# Patient Record
Sex: Female | Born: 1965
Health system: Southern US, Community
[De-identification: ages and names within clinical notes are randomized; demographics above are authoritative.]

## PROBLEM LIST (undated history)

## (undated) DIAGNOSIS — I10 Essential (primary) hypertension: Secondary | ICD-10-CM

## (undated) DIAGNOSIS — E559 Vitamin D deficiency, unspecified: Secondary | ICD-10-CM

## (undated) HISTORY — DX: Essential (primary) hypertension: I10

## (undated) HISTORY — PX: ABDOMINAL HYSTERECTOMY: SHX81

## (undated) HISTORY — DX: Vitamin D deficiency, unspecified: E55.9

---

## 1998-12-10 ENCOUNTER — Other Ambulatory Visit: Admission: RE | Admit: 1998-12-10 | Discharge: 1998-12-10 | Payer: Self-pay | Admitting: Gynecology

## 1999-07-24 ENCOUNTER — Ambulatory Visit (HOSPITAL_COMMUNITY): Admission: RE | Admit: 1999-07-24 | Discharge: 1999-07-24 | Payer: Self-pay | Admitting: Gynecology

## 2001-01-10 ENCOUNTER — Other Ambulatory Visit: Admission: RE | Admit: 2001-01-10 | Discharge: 2001-01-10 | Payer: Self-pay | Admitting: Gynecology

## 2001-01-28 ENCOUNTER — Other Ambulatory Visit: Admission: RE | Admit: 2001-01-28 | Discharge: 2001-01-28 | Payer: Self-pay | Admitting: Gynecology

## 2001-05-31 ENCOUNTER — Other Ambulatory Visit: Admission: RE | Admit: 2001-05-31 | Discharge: 2001-05-31 | Payer: Self-pay | Admitting: Obstetrics and Gynecology

## 2001-07-25 ENCOUNTER — Encounter: Payer: Self-pay | Admitting: Obstetrics and Gynecology

## 2001-07-25 ENCOUNTER — Ambulatory Visit (HOSPITAL_COMMUNITY): Admission: RE | Admit: 2001-07-25 | Discharge: 2001-07-25 | Payer: Self-pay | Admitting: Obstetrics and Gynecology

## 2001-09-26 ENCOUNTER — Encounter: Payer: Self-pay | Admitting: Obstetrics and Gynecology

## 2001-09-26 ENCOUNTER — Ambulatory Visit (HOSPITAL_COMMUNITY): Admission: RE | Admit: 2001-09-26 | Discharge: 2001-09-26 | Payer: Self-pay | Admitting: Obstetrics and Gynecology

## 2001-10-09 ENCOUNTER — Inpatient Hospital Stay (HOSPITAL_COMMUNITY): Admission: AD | Admit: 2001-10-09 | Discharge: 2001-10-09 | Payer: Self-pay | Admitting: Obstetrics and Gynecology

## 2001-10-19 ENCOUNTER — Inpatient Hospital Stay (HOSPITAL_COMMUNITY): Admission: AD | Admit: 2001-10-19 | Discharge: 2001-10-19 | Payer: Self-pay | Admitting: Obstetrics and Gynecology

## 2001-10-22 ENCOUNTER — Observation Stay (HOSPITAL_COMMUNITY): Admission: AD | Admit: 2001-10-22 | Discharge: 2001-10-23 | Payer: Self-pay | Admitting: Obstetrics and Gynecology

## 2001-10-23 ENCOUNTER — Encounter: Payer: Self-pay | Admitting: Obstetrics and Gynecology

## 2001-10-24 ENCOUNTER — Encounter: Payer: Self-pay | Admitting: Obstetrics and Gynecology

## 2001-10-24 ENCOUNTER — Inpatient Hospital Stay (HOSPITAL_COMMUNITY): Admission: AD | Admit: 2001-10-24 | Discharge: 2001-10-24 | Payer: Self-pay | Admitting: Obstetrics and Gynecology

## 2001-10-27 ENCOUNTER — Ambulatory Visit (HOSPITAL_COMMUNITY): Admission: RE | Admit: 2001-10-27 | Discharge: 2001-10-27 | Payer: Self-pay | Admitting: Obstetrics and Gynecology

## 2001-10-27 ENCOUNTER — Encounter: Payer: Self-pay | Admitting: Obstetrics and Gynecology

## 2001-10-28 ENCOUNTER — Inpatient Hospital Stay (HOSPITAL_COMMUNITY): Admission: AD | Admit: 2001-10-28 | Discharge: 2001-11-06 | Payer: Self-pay | Admitting: Obstetrics and Gynecology

## 2001-10-31 ENCOUNTER — Encounter: Payer: Self-pay | Admitting: Obstetrics and Gynecology

## 2001-11-03 ENCOUNTER — Encounter: Payer: Self-pay | Admitting: Obstetrics and Gynecology

## 2001-11-07 ENCOUNTER — Encounter: Admission: RE | Admit: 2001-11-07 | Discharge: 2001-12-07 | Payer: Self-pay | Admitting: Obstetrics and Gynecology

## 2001-12-08 ENCOUNTER — Encounter: Admission: RE | Admit: 2001-12-08 | Discharge: 2002-01-07 | Payer: Self-pay | Admitting: Obstetrics and Gynecology

## 2002-01-08 ENCOUNTER — Encounter: Admission: RE | Admit: 2002-01-08 | Discharge: 2002-02-07 | Payer: Self-pay | Admitting: Obstetrics and Gynecology

## 2002-02-08 ENCOUNTER — Encounter: Admission: RE | Admit: 2002-02-08 | Discharge: 2002-03-10 | Payer: Self-pay | Admitting: Obstetrics and Gynecology

## 2003-01-25 ENCOUNTER — Other Ambulatory Visit: Admission: RE | Admit: 2003-01-25 | Discharge: 2003-01-25 | Payer: Self-pay | Admitting: Obstetrics and Gynecology

## 2003-04-30 ENCOUNTER — Encounter: Payer: Self-pay | Admitting: Internal Medicine

## 2003-04-30 ENCOUNTER — Encounter: Admission: RE | Admit: 2003-04-30 | Discharge: 2003-04-30 | Payer: Self-pay | Admitting: Internal Medicine

## 2003-07-29 ENCOUNTER — Emergency Department (HOSPITAL_COMMUNITY): Admission: EM | Admit: 2003-07-29 | Discharge: 2003-07-29 | Payer: Self-pay

## 2004-06-02 ENCOUNTER — Other Ambulatory Visit: Admission: RE | Admit: 2004-06-02 | Discharge: 2004-06-02 | Payer: Self-pay | Admitting: Obstetrics and Gynecology

## 2016-09-29 ENCOUNTER — Ambulatory Visit: Payer: Self-pay

## 2016-10-06 ENCOUNTER — Ambulatory Visit: Payer: Self-pay

## 2016-10-15 ENCOUNTER — Ambulatory Visit (INDEPENDENT_AMBULATORY_CARE_PROVIDER_SITE_OTHER): Payer: Managed Care, Other (non HMO) | Admitting: Family Medicine

## 2016-10-15 VITALS — BP 161/107 | HR 60 | Temp 97.8°F | Ht 66.5 in | Wt 223.2 lb

## 2016-10-15 DIAGNOSIS — I10 Essential (primary) hypertension: Secondary | ICD-10-CM

## 2016-10-15 DIAGNOSIS — Z13220 Encounter for screening for lipoid disorders: Secondary | ICD-10-CM | POA: Diagnosis not present

## 2016-10-15 DIAGNOSIS — Z1321 Encounter for screening for nutritional disorder: Secondary | ICD-10-CM

## 2016-10-15 DIAGNOSIS — Z1329 Encounter for screening for other suspected endocrine disorder: Secondary | ICD-10-CM | POA: Diagnosis not present

## 2016-10-15 DIAGNOSIS — Z130 Encounter for screening for diseases of the blood and blood-forming organs and certain disorders involving the immune mechanism: Secondary | ICD-10-CM

## 2016-10-15 MED ORDER — LISINOPRIL-HYDROCHLOROTHIAZIDE 20-25 MG PO TABS: 1.0000 | ORAL_TABLET | Freq: Every day | ORAL | 3 refills | Status: DC

## 2016-10-15 MED ORDER — POTASSIUM CHLORIDE CRYS ER 20 MEQ PO TBCR: 20.0000 meq | EXTENDED_RELEASE_TABLET | Freq: Every day | ORAL | 3 refills | Status: DC

## 2016-10-15 NOTE — Progress Notes (Signed)
Patient ID: Dominique Burton, female    DOB: 12/12/1965, 50 y.o.   MRN: [id number]  PCP: No primary care provider on file.  Chief Complaint  Patient presents with  . Medication Refill    Lisinipril and Klor    Subjective:   HPI 50 year old female presents for hypertension medication refill. [place] and was treated there by a primary care physician. She reports chronic hx of hypertension, vitamin D deficiency. Works at dialysis center. Reports that she checks her blood pressure daily and it typically runs in the 120's. She just took her medication today prior to her office visit and attributes that to an elevated readings today. Recently began to exercise and would like to lose weight. Family hx of hypertension involving both parents and sister. Recent surgery includes a partial hysterectomy 6 months ago.  Social History   Social History  . Marital status: Married    Spouse name: N/A  . Number of children: N/A  . Years of education: N/A   Occupational History  . Not on file.   Social History Main Topics  . Smoking status: Never Smoker  . Smokeless tobacco: Never Used  . Alcohol use No  . Drug use: No  . Sexual activity: Not on file   Other Topics Concern  . Not on file   Social History Narrative  . No narrative on file    Family History  Problem Relation Age of Onset  . Hypertension Mother   . Diabetes Father   . Hypertension Father   . Hypertension Sister   . Heart disease Brother   . Hypertension Brother   . Hypertension Maternal Grandmother   . Hypertension Maternal Grandfather      Review of Systems See HPI  There are no active problems to display for this patient.    Prior to Admission medications   Medication Sig Start Date End Date Taking? Authorizing Provider  lisinopril-hydrochlorothiazide (PRINZIDE,ZESTORETIC) 20-12.5 MG tablet Take 1 tablet by mouth daily.   Yes Historical Provider, MD  potassium  chloride (KLOR-CON) 20 MEQ packet Take by mouth daily.   Yes Historical Provider, MD     Allergies no known allergies     Objective:  Physical Exam  Constitutional: She is oriented to person, place, and time. She appears well-developed and well-nourished.  HENT:  Head: Normocephalic and atraumatic.  Eyes: Conjunctivae and EOM are normal. Pupils are equal, round, and reactive to light.  Neck: Normal range of motion. Neck supple. No tracheal deviation present.  Cardiovascular: Normal rate, regular rhythm, normal heart sounds and intact distal pulses.   No murmur heard. Pulmonary/Chest: Effort normal and breath sounds normal.  Lymphadenopathy:    She has no cervical adenopathy.  Neurological: She is alert and oriented to person, place, and time.  Skin: Skin is warm and dry.  Psychiatric: She has a normal mood and affect. Her behavior is normal. Judgment and thought content normal.      Vitals:   10/15/16 1024 10/15/16 1100  BP: (!) 168/108 (!) 161/107  Pulse: 65 60  Temp: 97.8 F (36.6 C)    Assessment & Plan:  1. Hypertension, unspecified type, unstable, uncontrolled as patient has not taken medication. - Comprehensive metabolic panel Plan: -Lisinopril-hydrochlorothiazide 20-25, 1 tablet daily -Potassium Chloride AS (KDUR) 20 MEQ daily 2. Screening, lipid - Lipid panel  3. Screening for thyroid disorder - Thyroid Panel With TSH  4. Screening for deficiency anemia - CBC with  Differential/Platelet - Ferritin - Vitamin B12  5. Encounter for vitamin deficiency screening - VITAMIN D 25 Hydroxy (Vit-D Deficiency, Fractures)  Establish care and return for follow-up as needed.  Godfrey PickKimberly S. Tiburcio PeaHarris, MSN, FNP-C [facility]

## 2016-10-15 NOTE — Patient Instructions (Addendum)
I have refilled medications.  I will follow-up with you regarding your lab results!   IF you received an x-ray today, you will receive an invoice from [facility]. Please contact [facility] at [phone] with questions or concerns regarding your invoice.   IF you received labwork today, you will receive an invoice from [facility]. Please contact [facility] at [phone] with questions or concerns regarding your invoice.   Our billing staff will not be able to assist you with questions regarding bills from these companies.  You will be contacted with the lab results as soon as they are available. The fastest way to get your results is to activate your My Chart account. Instructions are located on the last page of this paperwork. If you have not heard from us regarding the results in 2 weeks, please contact this office.     Hypertension Hypertension, commonly called high blood pressure, is when the force of blood pumping through your arteries is too strong. Your arteries are the blood vessels that carry blood from your heart throughout your body. A blood pressure reading consists of a higher number over a lower number, such as 110/72. The higher number (systolic) is the pressure inside your arteries when your heart pumps. The lower number (diastolic) is the pressure inside your arteries when your heart relaxes. Ideally you want your blood pressure below 120/80. Hypertension forces your heart to work harder to pump blood. Your arteries may become narrow or stiff. Having untreated or uncontrolled hypertension can cause heart attack, stroke, kidney disease, and other problems. What increases the risk? Some risk factors for high blood pressure are controllable. Others are not. Risk factors you cannot control include:  Race. You may be at higher risk if you are African American.  Age. Risk increases with age.  Gender. Men are at higher risk than  women before age 51 years. After age 51, women are at higher risk than men. Risk factors you can control include:  Not getting enough exercise or physical activity.  Being overweight.  Getting too much fat, sugar, calories, or salt in your diet.  Drinking too much alcohol. What are the signs or symptoms? Hypertension does not usually cause signs or symptoms. Extremely high blood pressure (hypertensive crisis) may cause headache, anxiety, shortness of breath, and nosebleed. How is this diagnosed? To check if you have hypertension, your health care provider will measure your blood pressure while you are seated, with your arm held at the level of your heart. It should be measured at least twice using the same arm. Certain conditions can cause a difference in blood pressure between your right and left arms. A blood pressure reading that is higher than normal on one occasion does not mean that you need treatment. If it is not clear whether you have high blood pressure, you may be asked to return on a different day to have your blood pressure checked again. Or, you may be asked to monitor your blood pressure at home for 1 or more weeks. How is this treated? Treating high blood pressure includes making lifestyle changes and possibly taking medicine. Living a healthy lifestyle can help lower high blood pressure. You may need to change some of your habits. Lifestyle changes may include:  Following the DASH diet. This diet is high in fruits, vegetables, and whole grains. It is low in salt, red meat, and added sugars.  Keep your sodium intake below 2,300 mg per day.  Getting at least 30-45  minutes of aerobic exercise at least 4 times per week.  Losing weight if necessary.  Not smoking.  Limiting alcoholic beverages.  Learning ways to reduce stress. Your health care provider may prescribe medicine if lifestyle changes are not enough to get your blood pressure under control, and if one of the  following is true:  You are 7018-51 years of age and your systolic blood pressure is above 140.  You are 51 years of age or older, and your systolic blood pressure is above 150.  Your diastolic blood pressure is above 90.  You have diabetes, and your systolic blood pressure is over 140 or your diastolic blood pressure is over 90.  You have kidney disease and your blood pressure is above 140/90.  You have heart disease and your blood pressure is above 140/90. Your personal target blood pressure may vary depending on your medical conditions, your age, and other factors. Follow these instructions at home:  Have your blood pressure rechecked as directed by your health care provider.  Take medicines only as directed by your health care provider. Follow the directions carefully. Blood pressure medicines must be taken as prescribed. The medicine does not work as well when you skip doses. Skipping doses also puts you at risk for problems.  Do not smoke.  Monitor your blood pressure at home as directed by your health care provider. Contact a health care provider if:  You think you are having a reaction to medicines taken.  You have recurrent headaches or feel dizzy.  You have swelling in your ankles.  You have trouble with your vision. Get help right away if:  You develop a severe headache or confusion.  You have unusual weakness, numbness, or feel faint.  You have severe chest or abdominal pain.  You vomit repeatedly.  You have trouble breathing. This information is not intended to replace advice given to you by your health care provider. Make sure you discuss any questions you have with your health care provider. Document Released: 10/26/2005 Document Revised: 04/02/2016 Document Reviewed: 08/18/2013 Elsevier Interactive Patient Education  2017 [employer]

## 2016-10-16 LAB — VITAMIN B12: Vitamin B-12: 267 pg/mL (ref 232–1245)

## 2016-10-16 LAB — COMPREHENSIVE METABOLIC PANEL
ALBUMIN: 4.5 g/dL (ref 3.5–5.5)
ALK PHOS: 92 IU/L (ref 39–117)
ALT: 10 IU/L (ref 0–32)
AST: 16 IU/L (ref 0–40)
Albumin/Globulin Ratio: 1.6 (ref 1.2–2.2)
BUN/Creatinine Ratio: 12 (ref 9–23)
BUN: 8 mg/dL (ref 6–24)
Bilirubin Total: 0.4 mg/dL (ref 0.0–1.2)
CALCIUM: 9.3 mg/dL (ref 8.7–10.2)
CO2: 27 mmol/L (ref 18–29)
CREATININE: 0.65 mg/dL (ref 0.57–1.00)
Chloride: 102 mmol/L (ref 96–106)
GFR calc Af Amer: 119 mL/min/{1.73_m2} (ref >59)
GFR calc non Af Amer: 103 mL/min/{1.73_m2} (ref >59)
GLUCOSE: 84 mg/dL (ref 65–99)
Globulin, Total: 2.8 g/dL (ref 1.5–4.5)
Potassium: 4.1 mmol/L (ref 3.5–5.2)
Sodium: 144 mmol/L (ref 134–144)
Total Protein: 7.3 g/dL (ref 6.0–8.5)

## 2016-10-16 LAB — THYROID PANEL WITH TSH
FREE THYROXINE INDEX: 1.7 (ref 1.2–4.9)
T3 Uptake Ratio: 25 % (ref 24–39)
T4 TOTAL: 6.8 ug/dL (ref 4.5–12.0)
TSH: 1.4 u[IU]/mL (ref 0.450–4.500)

## 2016-10-16 LAB — CBC WITH DIFFERENTIAL/PLATELET
BASOS ABS: 0 10*3/uL (ref 0.0–0.2)
Basos: 0 %
EOS (ABSOLUTE): 0.1 10*3/uL (ref 0.0–0.4)
Eos: 1 %
HEMOGLOBIN: 13.4 g/dL (ref 11.1–15.9)
Hematocrit: 41 % (ref 34.0–46.6)
IMMATURE GRANULOCYTES: 0 %
Immature Grans (Abs): 0 10*3/uL (ref 0.0–0.1)
LYMPHS ABS: 1.8 10*3/uL (ref 0.7–3.1)
LYMPHS: 28 %
MCH: 28.8 pg (ref 26.6–33.0)
MCHC: 32.7 g/dL (ref 31.5–35.7)
MCV: 88 fL (ref 79–97)
MONOCYTES: 6 %
Monocytes Absolute: 0.4 10*3/uL (ref 0.1–0.9)
NEUTROS PCT: 65 %
Neutrophils Absolute: 4.3 10*3/uL (ref 1.4–7.0)
Platelets: 328 10*3/uL (ref 150–379)
RBC: 4.66 x10E6/uL (ref 3.77–5.28)
RDW: 15 % (ref 12.3–15.4)
WBC: 6.6 10*3/uL (ref 3.4–10.8)

## 2016-10-16 LAB — FERRITIN: Ferritin: 98 ng/mL (ref 15–150)

## 2016-10-16 LAB — VITAMIN D 25 HYDROXY (VIT D DEFICIENCY, FRACTURES): VIT D 25 HYDROXY: 22.2 ng/mL — AB (ref 30.0–100.0)

## 2016-10-16 MED ORDER — VITAMIN D (ERGOCALCIFEROL) 1.25 MG (50000 UNIT) PO CAPS: 50000.0000 [IU] | ORAL_CAPSULE | ORAL | 1 refills | Status: DC

## 2016-10-17 ENCOUNTER — Encounter: Payer: Self-pay | Admitting: Family Medicine

## 2016-10-17 ENCOUNTER — Telehealth: Payer: Self-pay

## 2016-10-17 LAB — LIPID PANEL

## 2016-10-17 NOTE — Telephone Encounter (Signed)
MS. Dominique Burton    Can you check for cholestrol  From Fridays blood draw  [phone]

## 2016-10-17 NOTE — Telephone Encounter (Signed)
[facility] notified of cancelled test. The test was received yesterday and will be resulted today.

## 2016-10-18 LAB — LIPID PANEL
CHOL/HDL RATIO: 2.6 ratio (ref 0.0–4.4)
Cholesterol, Total: 154 mg/dL (ref 100–199)
HDL: 59 mg/dL (ref >39)
LDL CALC: 72 mg/dL (ref 0–99)
TRIGLYCERIDES: 113 mg/dL (ref 0–149)
VLDL CHOLESTEROL CAL: 23 mg/dL (ref 5–40)

## 2016-10-18 LAB — SPECIMEN STATUS REPORT

## 2016-10-19 ENCOUNTER — Telehealth: Payer: Self-pay

## 2016-10-19 NOTE — Telephone Encounter (Signed)
Patient had some medications refilled on 12/07.  She states that the wrong dosage of medication was filled.  She states that she needs the potassium chloride 20-12.5mg  instead of the 20-25mg  tablet.  Please advise!!  [phone]

## 2016-10-20 NOTE — Telephone Encounter (Signed)
Please advise 

## 2016-10-20 NOTE — Telephone Encounter (Signed)
Here dosage is correct. I increased her diuretic portion as her blood pressure, specifically her diastolic readings were very high on exam. I did explain to her that I was increasing the HCTZ portion of her medication. Please advise patient.   Thanks

## 2016-10-22 NOTE — Telephone Encounter (Signed)
Advised to call clinic if needed further instructions

## 2016-10-22 NOTE — Telephone Encounter (Signed)
Left detailed message per Pump[place] Hollow[place] message.

## 2016-11-17 ENCOUNTER — Other Ambulatory Visit: Payer: Self-pay | Admitting: Family Medicine

## 2016-11-17 MED ORDER — OSELTAMIVIR PHOSPHATE 75 MG PO CAPS: 75.0000 mg | ORAL_CAPSULE | Freq: Every day | ORAL | 0 refills | Status: DC

## 2016-11-17 NOTE — Progress Notes (Signed)
Tamiflu prevention. Daughter dx with influenza x 1 week ago. Son now has symptoms of influenza.

## 2017-02-05 ENCOUNTER — Emergency Department (HOSPITAL_COMMUNITY)
Admission: EM | Admit: 2017-02-05 | Discharge: 2017-02-05 | Disposition: A | Discharge status: Home / Self Care | Payer: Managed Care, Other (non HMO) | Attending: Emergency Medicine | Admitting: Emergency Medicine

## 2017-02-05 ENCOUNTER — Encounter (HOSPITAL_COMMUNITY): Payer: Self-pay

## 2017-02-05 ENCOUNTER — Emergency Department (HOSPITAL_COMMUNITY): Payer: Managed Care, Other (non HMO)

## 2017-02-05 DIAGNOSIS — Z79899 Other long term (current) drug therapy: Secondary | ICD-10-CM | POA: Insufficient documentation

## 2017-02-05 DIAGNOSIS — I10 Essential (primary) hypertension: Secondary | ICD-10-CM | POA: Diagnosis not present

## 2017-02-05 LAB — BASIC METABOLIC PANEL
Anion gap: 6 (ref 5–15)
BUN: 8 mg/dL (ref 6–20)
CALCIUM: 9.3 mg/dL (ref 8.9–10.3)
CO2: 29 mmol/L (ref 22–32)
CREATININE: 0.86 mg/dL (ref 0.44–1.00)
Chloride: 102 mmol/L (ref 101–111)
GFR calc non Af Amer: >60 mL/min (ref >60)
Glucose, Bld: 94 mg/dL (ref 65–99)
Potassium: 3.4 mmol/L — ABNORMAL LOW (ref 3.5–5.1)
SODIUM: 137 mmol/L (ref 135–145)

## 2017-02-05 LAB — CBC
HEMATOCRIT: 38.7 % (ref 36.0–46.0)
HEMOGLOBIN: 13.2 g/dL (ref 12.0–15.0)
MCH: 28.8 pg (ref 26.0–34.0)
MCHC: 34.1 g/dL (ref 30.0–36.0)
MCV: 84.3 fL (ref 78.0–100.0)
Platelets: 320 10*3/uL (ref 150–400)
RBC: 4.59 MIL/uL (ref 3.87–5.11)
RDW: 13.6 % (ref 11.5–15.5)
WBC: 5.5 10*3/uL (ref 4.0–10.5)

## 2017-02-05 LAB — I-STAT TROPONIN, ED
TROPONIN I, POC: 0 ng/mL (ref 0.00–0.08)
Troponin i, poc: 0 ng/mL (ref 0.00–0.08)

## 2017-02-05 NOTE — ED Triage Notes (Signed)
PT C/O HTN SINCE LAST NIGHT. PT STS SHE TOOK LISINOPRIL. THIS MORNING, HER PRESSURE WAS STILL ELEVATED, SO SHE TOOK ANOTHER LISINOPRIL  PTA. PT ALSO C/O LEFT-SIDED CHEST PRESSURE.

## 2017-02-05 NOTE — ED Provider Notes (Signed)
[facility]-EMERGENCY DEPT Provider Note   CSN: [id number] Arrival date & time: 02/05/17  1005     History   Chief Complaint Chief Complaint  Patient presents with  . Hypertension    HPI Dominique Burton is a 51 y.o. female.  HPI   Patient presents to the ER with PMH of hypertension and vitamin D deficiency with complaints of hypertension.  She takes lisinopril-HCTZ and potassium. She checks her BP bid and it typically runs 120's/80's. She has been extremely stressed out today and developed left sided chest pressure. The pain lasted an hour and did not radiate, was not associated with any nausea, diarrhea, diaphoresis, vomiting, back pain, radiation. Exertion/rest did not improve her symptoms. She took her BP and reports it was 237/100? Upon arrival her BP is now 168/95 without any intervention. She is no longer having any chest pain or any other symptoms. She admits that she was feeling anxious and stressed this morning. No LE swelling. No headaches, weakness, confusion, ataxia, aphagia. No known heart disease.  Past Medical History:  Diagnosis Date  . Hypertension     There are no active problems to display for this patient.   Past Surgical History:  Procedure Laterality Date  . ABDOMINAL HYSTERECTOMY     PARTIAL  . CESAREAN SECTION      OB History    No data available       Home Medications    Prior to Admission medications   Medication Sig Start Date End Date Taking? Authorizing Provider  lisinopril-hydrochlorothiazide (PRINZIDE,ZESTORETIC) 20-25 MG tablet Take 1 tablet by mouth daily. 10/15/16  Yes Doyle Askew, FNP  potassium chloride SA (K-DUR,KLOR-CON) 20 MEQ tablet Take 1 tablet (20 mEq total) by mouth daily. 10/15/16  Yes Doyle Askew, FNP  Vitamin D, Ergocalciferol, (DRISDOL) 50000 units CAPS capsule Take 1 capsule (50,000 Units total) by mouth every 7 (seven) days. 10/16/16  Yes Doyle Askew, FNP  oseltamivir (TAMIFLU) 75 MG  capsule Take 1 capsule (75 mg total) by mouth daily. Patient not taking: Reported on 02/05/2017 11/17/16   Doyle Askew, FNP    Family History Family History  Problem Relation Age of Onset  . Hypertension Mother   . Diabetes Father   . Hypertension Father   . Hypertension Sister   . Heart disease Brother   . Hypertension Brother   . Hypertension Maternal Grandmother   . Hypertension Maternal Grandfather     Social History Social History  Substance Use Topics  . Smoking status: Never Smoker  . Smokeless tobacco: Never Used  . Alcohol use No     Allergies   Patient has no known allergies.   Review of Systems Review of Systems Review of Systems All other systems negative except as documented in the HPI. All pertinent positives and negatives as reviewed in the HPI.   Physical Exam Updated Vital Signs BP (!) 168/95   Pulse 67   Temp 97.7 F (36.5 C) (Oral)   Resp 12   Ht  (1.676 m)   Wt 99.8 kg   SpO2 100%   BMI 35.51 kg/m   Physical Exam  Constitutional: She appears well-developed and well-nourished. No distress.  HENT:  Head: Normocephalic and atraumatic.  Right Ear: Tympanic membrane and ear canal normal.  Left Ear: Tympanic membrane and ear canal normal.  Nose: Nose normal.  Mouth/Throat: Uvula is midline, oropharynx is clear and moist and mucous membranes are normal.  Eyes: Pupils are equal, round, and  reactive to light.  Neck: Normal range of motion. Neck supple.  Cardiovascular: Normal rate and regular rhythm.   Pulses:      Carotid pulses are 0 on the right side, and 0 on the left side. Pulmonary/Chest: Effort normal.  Abdominal: Soft.  No signs of abdominal distention  Musculoskeletal:  No LE swelling  Neurological: She is alert.  Acting at baseline  Skin: Skin is warm and dry. No rash noted.  Nursing note and vitals reviewed.   ED Treatments / Results  Labs (all labs ordered are listed, but only abnormal results are  displayed) Labs Reviewed  BASIC METABOLIC PANEL - Abnormal; Notable for the following:       Result Value   Potassium 3.4 (*)    All other components within normal limits  CBC  I-STAT TROPOININ, ED    EKG  EKG Interpretation  Date/Time:  Friday February 05 2017 10:13:16 EDT Ventricular Rate:  67 PR Interval:    QRS Duration: 99 QT Interval:  434 QTC Calculation: 459 R Axis:   1 Text Interpretation:  Sinus rhythm Low voltage, precordial leads Abnormal R-wave progression, early transition No significant change since last tracing Confirmed by ALLEN  MD, ANTHONY ([id number]) on 02/05/2017 11:22:55 AM       Radiology Dg Chest 2 View  Result Date: 02/05/2017 CLINICAL DATA:  Left-sided chest pain, pressure EXAM: CHEST  2 VIEW COMPARISON:  None. FINDINGS: Lungs are clear.  No pleural effusion or pneumothorax. The heart is normal in size. Mild degenerative changes of the visualized thoracolumbar spine. IMPRESSION: Normal chest radiographs. Electronically Signed   By: Charline Bills M.D.   On: 02/05/2017 10:58    Procedures Procedures (including critical care time)  Medications Ordered in ED Medications - No data to display   Initial Impression / Assessment and Plan / ED Course  I have reviewed the triage vital signs and the nursing notes.  Pertinent labs & imaging results that were available during my care of the patient were reviewed by me and considered in my medical decision making (see chart for details).     Patient has had two negative troponins in the setting of atypical chest pain. Her blood pressure is typically normal but today was elevated while stressed and anxious over personal reasons. Her heart score is 2- age and hypertension. Since her BP has been running normal and we did not provide any intervention for it to resolve I will not start her on any antihypertensive medications at this visit. I recommend she have close follow-up with her PCP and will refer her to  cardiology for outpatient work-up. Advised her if chest pain returns or her BP shoots up again to come back to the ER for further evaluation.  Medications - No data to display  I discussed results, diagnoses and plan with Hanley Hays. They voice there understanding and questions were answered. We discussed follow-up recommendations and return precautions.   Final Clinical Impressions(s) / ED Diagnoses   Final diagnoses:  Hypertension, unspecified type    New Prescriptions New Prescriptions   No medications on file     Marlon Pel, Cordelia Poche 02/05/17 1242    Lorre Nick, MD 02/07/17 [phone]

## 2017-04-11 ENCOUNTER — Other Ambulatory Visit: Payer: Self-pay | Admitting: Family Medicine

## 2017-04-30 ENCOUNTER — Telehealth: Payer: Self-pay | Admitting: Family Medicine

## 2017-04-30 MED ORDER — POTASSIUM CHLORIDE CRYS ER 20 MEQ PO TBCR: 20.0000 meq | EXTENDED_RELEASE_TABLET | Freq: Every day | ORAL | 0 refills | Status: DC

## 2017-04-30 MED ORDER — LISINOPRIL-HYDROCHLOROTHIAZIDE 20-25 MG PO TABS: 1.0000 | ORAL_TABLET | Freq: Every day | ORAL | 0 refills | Status: DC

## 2017-04-30 NOTE — Telephone Encounter (Signed)
PT CALLING FOR A REFILL ON LISINOPRIL-HCTZ AND POTASSIUM PILL SHE WAS A [facility] PT NOW SHE WILL BE SEEING ENGLISH ON 05-13-17 PLEASE FILL UNTIL APPOINTMENT USE THE [facility] ON [address]

## 2017-04-30 NOTE — Telephone Encounter (Signed)
Please advise 

## 2017-05-13 ENCOUNTER — Ambulatory Visit: Payer: Managed Care, Other (non HMO) | Admitting: Physician Assistant

## 2017-05-20 ENCOUNTER — Ambulatory Visit: Payer: Managed Care, Other (non HMO) | Admitting: Physician Assistant

## 2017-05-25 ENCOUNTER — Ambulatory Visit: Payer: Managed Care, Other (non HMO) | Admitting: Physician Assistant

## 2017-06-03 ENCOUNTER — Ambulatory Visit (INDEPENDENT_AMBULATORY_CARE_PROVIDER_SITE_OTHER): Payer: Managed Care, Other (non HMO) | Admitting: Physician Assistant

## 2017-06-03 ENCOUNTER — Encounter: Payer: Self-pay | Admitting: Physician Assistant

## 2017-06-03 VITALS — BP 130/85 | HR 67 | Temp 98.6°F | Resp 18 | Ht 65.75 in | Wt 225.8 lb

## 2017-06-03 DIAGNOSIS — E559 Vitamin D deficiency, unspecified: Secondary | ICD-10-CM | POA: Diagnosis not present

## 2017-06-03 DIAGNOSIS — I10 Essential (primary) hypertension: Secondary | ICD-10-CM

## 2017-06-03 DIAGNOSIS — F411 Generalized anxiety disorder: Secondary | ICD-10-CM

## 2017-06-03 MED ORDER — SERTRALINE HCL 50 MG PO TABS: 25.0000 mg | ORAL_TABLET | Freq: Every day | ORAL | 1 refills | Status: DC

## 2017-06-03 MED ORDER — LISINOPRIL-HYDROCHLOROTHIAZIDE 20-12.5 MG PO TABS
1.0000 | ORAL_TABLET | Freq: Every day | ORAL | 1 refills | Status: DC
Start: 2017-06-03 — End: 2017-11-11

## 2017-06-03 MED ORDER — POTASSIUM CHLORIDE CRYS ER 20 MEQ PO TBCR: 20.0000 meq | EXTENDED_RELEASE_TABLET | Freq: Every day | ORAL | 1 refills | Status: DC

## 2017-06-03 NOTE — Patient Instructions (Signed)
     IF you received an x-ray today, you will receive an invoice from [facility]. Please contact  Radiology at [phone] with questions or concerns regarding your invoice.   IF you received labwork today, you will receive an invoice from [facility]. Please contact [facility] at [phone] with questions or concerns regarding your invoice.   Our billing staff will not be able to assist you with questions regarding bills from these companies.  You will be contacted with the lab results as soon as they are available. The fastest way to get your results is to activate your My Chart account. Instructions are located on the last page of this paperwork. If you have not heard from us regarding the results in 2 weeks, please contact this office.     

## 2017-06-04 LAB — BASIC METABOLIC PANEL
BUN / CREAT RATIO: 12 (ref 9–23)
BUN: 8 mg/dL (ref 6–24)
CO2: 27 mmol/L (ref 20–29)
Calcium: 9.8 mg/dL (ref 8.7–10.2)
Chloride: 100 mmol/L (ref 96–106)
Creatinine, Ser: 0.69 mg/dL (ref 0.57–1.00)
GFR calc Af Amer: 116 mL/min/{1.73_m2} (ref >59)
GFR, EST NON AFRICAN AMERICAN: 100 mL/min/{1.73_m2} (ref >59)
Glucose: 100 mg/dL — ABNORMAL HIGH (ref 65–99)
POTASSIUM: 3.7 mmol/L (ref 3.5–5.2)
SODIUM: 142 mmol/L (ref 134–144)

## 2017-06-04 LAB — CBC
Hematocrit: 41.8 % (ref 34.0–46.6)
Hemoglobin: 13.4 g/dL (ref 11.1–15.9)
MCH: 28.6 pg (ref 26.6–33.0)
MCHC: 32.1 g/dL (ref 31.5–35.7)
MCV: 89 fL (ref 79–97)
PLATELETS: 262 10*3/uL (ref 150–379)
RBC: 4.69 x10E6/uL (ref 3.77–5.28)
RDW: 14.6 % (ref 12.3–15.4)
WBC: 7.6 10*3/uL (ref 3.4–10.8)

## 2017-06-04 LAB — VITAMIN D 25 HYDROXY (VIT D DEFICIENCY, FRACTURES): VIT D 25 HYDROXY: 22.6 ng/mL — AB (ref 30.0–100.0)

## 2017-06-04 NOTE — Progress Notes (Signed)
[facility] [address], [place] [phone]  Date:  06/03/2017   Name:  Dominique Burton   DOB:  12/16/1965   MRN:  [id number]  PCP:  Patient, No Pcp Per    History of Present Illness:  Dominique Haysaula Y Fulbright is a 51 y.o. female patient who presents to PCP with  Chief Complaint  Patient presents with  . Hypertension    wants to discuss meds     --patient would like a refill of her hypertensive medication.  Patient reports that she used to take the zestoretic 20-12.5, but this was increased somehow.  She had noted that she had more fatigue with the other 20-25.   --she denies cp, palpitations, sob, or dyspnea.  No leg swellings. --she has not taken any medication today. --she is also concerned of her anxiety.  She has moments of intense worrying.  She worries for her children, and her family.  She was once issued a medicaiton for her anxiety (zoloft) but she had never picked it up. --she denies feelings of sadness, loss of interest, si/hi. --she is fairly happy person --she has a hx of panic attacks years ago when raising her children as babies, 25 years ago.  States her spirituality helped her through prayer.  Continues her spirtuality.  She is worried for the stressors that are coming.  Her oldest daughter is expecting in about 2 weeks.  She is very supportive. --vitamin D, finished months ago, however never rechecked.  There are no active problems to display for this patient.   Past Medical History:  Diagnosis Date  . Hypertension     Past Surgical History:  Procedure Laterality Date  . ABDOMINAL HYSTERECTOMY     PARTIAL  . CESAREAN SECTION      Social History  Substance Use Topics  . Smoking status: Never Smoker  . Smokeless tobacco: Never Used  . Alcohol use No    Family History  Problem Relation Age of Onset  . Hypertension Mother   . Diabetes Father   . Hypertension Father   . Hypertension Sister   . Heart disease Brother   . Hypertension  Brother   . Hypertension Maternal Grandmother   . Hypertension Maternal Grandfather     No Known Allergies  Medication list has been reviewed and updated.  Current Outpatient Prescriptions on File Prior to Visit  Medication Sig Dispense Refill  . Vitamin D, Ergocalciferol, (DRISDOL) 50000 units CAPS capsule Take 1 capsule (50,000 Units total) by mouth every 7 (seven) days. 13 capsule 1  . oseltamivir (TAMIFLU) 75 MG capsule Take 1 capsule (75 mg total) by mouth daily. (Patient not taking: Reported on 02/05/2017) 10 capsule 0   No current facility-administered medications on file prior to visit.     ROS ROS otherwise unremarkable unless listed above.  Physical Examination: BP 130/85   Pulse 67   Temp 98.6 F (37 C) (Oral)   Resp 18   Ht 5' 5.75" (1.67 m)   Wt 225 lb 12.8 oz (102.4 kg)   SpO2 96%   BMI 36.72 kg/m  Ideal Body Weight: Weight in (lb) to have BMI = 25: 153.4  Physical Exam  Constitutional: She is oriented to person, place, and time. She appears well-developed and well-nourished. No distress.  HENT:  Head: Normocephalic and atraumatic.  Right Ear: External ear normal.  Left Ear: External ear normal.  Eyes: Pupils are equal, round, and reactive to light. Conjunctivae and EOM are  normal.  Neck: No thyroid mass and no thyromegaly present.  Cardiovascular: Normal rate, regular rhythm and intact distal pulses.  Exam reveals no friction rub.   No murmur heard. Pulmonary/Chest: Effort normal. No respiratory distress. She has no wheezes.  Neurological: She is alert and oriented to person, place, and time.  Skin: She is not diaphoretic.  Psychiatric: She has a normal mood and affect. Her behavior is normal.     Assessment and Plan: Dominique Haysaula Y Kirkpatrick is a 51 y.o. female who is here today for cc of bp recheck, medication refill, and anxiety bp stable, will decrease to the 20-12.5, recheck in 6 months Starting zoloft 25mg --she wants this but is reluctant.  Discussed the  continued use of the zoloft.  Follow up in 5 weeks.  Essential hypertension - Plan: Basic metabolic panel, CBC, potassium chloride SA (K-DUR,KLOR-CON) 20 MEQ tablet, lisinopril-hydrochlorothiazide (ZESTORETIC) 20-12.5 MG tablet  Vitamin D deficiency - Plan: VITAMIN D 25 Hydroxy (Vit-D Deficiency, Fractures)  Generalized anxiety disorder - Plan: sertraline (ZOLOFT) 50 MG tablet  Trena PlattStephanie English, PA-C [facility] 7/27/201810:10 AM

## 2017-06-07 ENCOUNTER — Telehealth: Payer: Self-pay | Admitting: Family Medicine

## 2017-06-07 NOTE — Telephone Encounter (Signed)
Pt calling about labs °

## 2017-06-07 NOTE — Telephone Encounter (Signed)
Can you review these results

## 2017-06-10 ENCOUNTER — Other Ambulatory Visit: Payer: Self-pay | Admitting: Physician Assistant

## 2017-06-10 DIAGNOSIS — E559 Vitamin D deficiency, unspecified: Secondary | ICD-10-CM

## 2017-11-11 ENCOUNTER — Ambulatory Visit (INDEPENDENT_AMBULATORY_CARE_PROVIDER_SITE_OTHER): Payer: 59 | Admitting: Physician Assistant

## 2017-11-11 ENCOUNTER — Other Ambulatory Visit: Payer: Self-pay

## 2017-11-11 ENCOUNTER — Encounter: Payer: Self-pay | Admitting: Physician Assistant

## 2017-11-11 VITALS — BP 162/97 | HR 77 | Temp 98.8°F | Resp 16 | Ht 65.0 in | Wt 223.0 lb

## 2017-11-11 DIAGNOSIS — J069 Acute upper respiratory infection, unspecified: Secondary | ICD-10-CM

## 2017-11-11 DIAGNOSIS — R070 Pain in throat: Secondary | ICD-10-CM

## 2017-11-11 DIAGNOSIS — I10 Essential (primary) hypertension: Secondary | ICD-10-CM | POA: Diagnosis not present

## 2017-11-11 LAB — POCT RAPID STREP A (OFFICE): Rapid Strep A Screen: NEGATIVE

## 2017-11-11 MED ORDER — LISINOPRIL-HYDROCHLOROTHIAZIDE 20-12.5 MG PO TABS: 1.0000 | ORAL_TABLET | Freq: Every day | ORAL | 1 refills | Status: DC

## 2017-11-11 MED ORDER — POTASSIUM CHLORIDE CRYS ER 20 MEQ PO TBCR: 20.0000 meq | EXTENDED_RELEASE_TABLET | Freq: Every day | ORAL | 1 refills | Status: DC

## 2017-11-11 MED ORDER — GUAIFENESIN ER 1200 MG PO TB12: 1.0000 | ORAL_TABLET | Freq: Two times a day (BID) | ORAL | 1 refills | Status: DC | PRN

## 2017-11-11 NOTE — Progress Notes (Signed)
[facility] PRIMARY CARE AT [facility] [address], [place] [address] [phone]  Date:  11/11/2017   Name:  Dominique Burton   DOB:  [birth year]   MRN:  [id number]  PCP:  Patient, No Pcp Per    History of Present Illness:  Dominique Burton is a 52 y.o. female patient who presents to PCP with  Chief Complaint  Patient presents with  . Sore Throat    x 1 day  . Nasal Congestion    x1 day     Today this morning, started sore throat and nasal congestion.   No fever No sob or dyspnea No cough. No ear discomfort.     There are no active problems to display for this patient.   Past Medical History:  Diagnosis Date  . Hypertension     Past Surgical History:  Procedure Laterality Date  . ABDOMINAL HYSTERECTOMY     PARTIAL  . CESAREAN SECTION      Social History   Tobacco Use  . Smoking status: Never Smoker  . Smokeless tobacco: Never Used  Substance Use Topics  . Alcohol use: No  . Drug use: No    Family History  Problem Relation Age of Onset  . Hypertension Mother   . Diabetes Father   . Hypertension Father   . Hypertension Sister   . Heart disease Brother   . Hypertension Brother   . Hypertension Maternal Grandmother   . Hypertension Maternal Grandfather     No Known Allergies  Medication list has been reviewed and updated.  Current Outpatient Medications on File Prior to Visit  Medication Sig Dispense Refill  . Vitamin D, Ergocalciferol, (DRISDOL) 50000 units CAPS capsule Take 1 capsule (50,000 Units total) by mouth every 7 (seven) days. 13 capsule 1   No current facility-administered medications on file prior to visit.     Review of Systems  HENT: Positive for congestion. Negative for ear pain, sinus pain and sore throat.   Cardiovascular: Negative for chest pain, palpitations and leg swelling.   ROS otherwise unremarkable unless listed above.  Physical Examination: BP (!) 162/97   Pulse 77   Temp 98.8 F (37.1 C) (Oral)   Resp 16   Ht 5\' 5"   (1.651 m)   Wt 223 lb (101.2 kg)   SpO2 98%   BMI 37.11 kg/m  Ideal Body Weight: Weight in (lb) to have BMI = 25: 149.9  Physical Exam  Constitutional: She is oriented to person, place, and time. She appears well-developed and well-nourished. No distress.  HENT:  Head: Normocephalic and atraumatic.  Right Ear: Tympanic membrane, external ear and ear canal normal.  Left Ear: Tympanic membrane, external ear and ear canal normal.  Nose: Mucosal edema and rhinorrhea present. Right sinus exhibits no maxillary sinus tenderness and no frontal sinus tenderness. Left sinus exhibits no maxillary sinus tenderness and no frontal sinus tenderness.  Mouth/Throat: No uvula swelling. No oropharyngeal exudate, posterior oropharyngeal edema or posterior oropharyngeal erythema.  Eyes: Conjunctivae and EOM are normal. Pupils are equal, round, and reactive to light.  Cardiovascular: Normal rate, regular rhythm and normal heart sounds. Exam reveals no gallop, no distant heart sounds and no friction rub.  No murmur heard. Pulmonary/Chest: Effort normal. No apnea. No respiratory distress. She has no decreased breath sounds. She has no wheezes. She has no rhonchi.  Lymphadenopathy:       Head (right side): No submandibular, no tonsillar, no preauricular and no posterior auricular adenopathy present.  Head (left side): No submandibular, no tonsillar, no preauricular and no posterior auricular adenopathy present.  Neurological: She is alert and oriented to person, place, and time.  Skin: She is not diaphoretic.  Psychiatric: She has a normal mood and affect. Her behavior is normal.     Assessment and Plan: Hanley Haysaula Y Panozzo is a 52 y.o. female who is here today for cc of  Chief Complaint  Patient presents with  . Sore Throat    x 1 day  . Nasal Congestion    x1 day   Acute upper respiratory infection  Essential hypertension - Plan: Basic metabolic panel, potassium chloride SA (K-DUR,KLOR-CON) 20 MEQ  tablet, lisinopril-hydrochlorothiazide (ZESTORETIC) 20-12.5 MG tablet, VITAMIN D 25 Hydroxy (Vit-D Deficiency, Fractures), CBC  Throat pain - Plan: POCT rapid strep A, Culture, Group A Strep, CBC  Trena PlattStephanie English, PA-C [facility] 1/15/20198:40 AM

## 2017-11-11 NOTE — Patient Instructions (Addendum)
I would like you to take tylenol for pain and fever.  I am obtaining a strep culture for the throat pain.   Gargle with salt water.  You can also use cepacol throat lozenges.    Pharyngitis Pharyngitis is a sore throat (pharynx). There is redness, pain, and swelling of your throat. Follow these instructions at home:  Drink enough fluids to keep your pee (urine) clear or pale yellow.  Only take medicine as told by your doctor. ? You may get sick again if you do not take medicine as told. Finish your medicines, even if you start to feel better. ? Do not take aspirin.  Rest.  Rinse your mouth (gargle) with salt water ( tsp of salt per 1 qt of water) every 1-2 hours. This will help the pain.  If you are not at risk for choking, you can suck on hard candy or sore throat lozenges. Contact a doctor if:  You have large, tender lumps on your neck.  You have a rash.  You cough up green, yellow-brown, or bloody spit. Get help right away if:  You have a stiff neck.  You drool or cannot swallow liquids.  You throw up (vomit) or are not able to keep medicine or liquids down.  You have very bad pain that does not go away with medicine.  You have problems breathing (not from a stuffy nose). This information is not intended to replace advice given to you by your health care provider. Make sure you discuss any questions you have with your health care provider. Document Released: 04/13/2008 Document Revised: 04/02/2016 Document Reviewed: 07/03/2013 Elsevier Interactive Patient Education  2017 [employer]       IF you received an x-ray today, you will receive an invoice from [facility]. Please contact [facility] at [phone] with questions or concerns regarding your invoice.   IF you received labwork today, you will receive an invoice from [facility]. Please contact [facility] at [phone] with questions or concerns regarding your invoice.   Our billing staff  will not be able to assist you with questions regarding bills from these companies.  You will be contacted with the lab results as soon as they are available. The fastest way to get your results is to activate your My Chart account. Instructions are located on the last page of this paperwork. If you have not heard from us regarding the results in 2 weeks, please contact this office.

## 2017-11-12 LAB — CBC
HEMATOCRIT: 39.1 % (ref 34.0–46.6)
HEMOGLOBIN: 12.8 g/dL (ref 11.1–15.9)
MCH: 29 pg (ref 26.6–33.0)
MCHC: 32.7 g/dL (ref 31.5–35.7)
MCV: 89 fL (ref 79–97)
Platelets: 281 10*3/uL (ref 150–379)
RBC: 4.41 x10E6/uL (ref 3.77–5.28)
RDW: 14.2 % (ref 12.3–15.4)
WBC: 7.9 10*3/uL (ref 3.4–10.8)

## 2017-11-12 LAB — BASIC METABOLIC PANEL
BUN / CREAT RATIO: 15 (ref 9–23)
BUN: 11 mg/dL (ref 6–24)
CO2: 27 mmol/L (ref 20–29)
CREATININE: 0.75 mg/dL (ref 0.57–1.00)
Calcium: 9.3 mg/dL (ref 8.7–10.2)
Chloride: 101 mmol/L (ref 96–106)
GFR calc non Af Amer: 92 mL/min/{1.73_m2} (ref >59)
GFR, EST AFRICAN AMERICAN: 106 mL/min/{1.73_m2} (ref >59)
Glucose: 110 mg/dL — ABNORMAL HIGH (ref 65–99)
Potassium: 3.5 mmol/L (ref 3.5–5.2)
Sodium: 142 mmol/L (ref 134–144)

## 2017-11-12 LAB — VITAMIN D 25 HYDROXY (VIT D DEFICIENCY, FRACTURES): VIT D 25 HYDROXY: 27.9 ng/mL — AB (ref 30.0–100.0)

## 2017-11-14 LAB — CULTURE, GROUP A STREP: STREP A CULTURE: NEGATIVE

## 2017-11-15 ENCOUNTER — Ambulatory Visit: Payer: Self-pay | Admitting: *Deleted

## 2017-11-15 ENCOUNTER — Telehealth: Payer: Self-pay | Admitting: Physician Assistant

## 2017-11-15 NOTE — Telephone Encounter (Signed)
Incoming call from patient engagement center, patient states she is feeling worse than when she saw Judeth CornfieldStephanie English at last appointment 11/11/17. States she has a lot of green mucous. Coworkers of hers have been diagnosed with pneumonia. She has been taking mucinex. Patient is requesting antibiotics, she states she "does well with azithromycin". Preferred pharmacy confirmed and updated.   Provider, please advise.

## 2017-11-15 NOTE — Telephone Encounter (Signed)
Pt states that she is not feeling better and her mucus is green; and she states that it is "coming out her nose and mouth"; pt recently on 11/11/17 and was instructed to call back if she is not better;the pt also states that she has been taking mucinex with no relief in symptoms; conference call initiated with Luanne BrasLizzie, and she will route this to the provider; [facility] [address]  Reason for Disposition . [1] Positive throat culture or rapid strep test (according to lab, PCP, caller, etc.) AND [2] NO  standing order to call in prescription for antibiotic  Answer Assessment - Initial Assessment Questions 1. ONSET: "When did the throat start hurting?" (Hours or days ago)     Last seen in MD's office 11/11/17 2. SEVERITY: "How bad is the sore throat?" (Scale 1-10; mild, moderate or severe)   - MILD (1-3):  doesn't interfere with eating or normal activities   - MODERATE (4-7): interferes with eating some solids and normal activities   - SEVERE (8-10):  excruciating pain, interferes with most normal activities   - SEVERE DYSPHAGIA: can't swallow liquids, drooling    severe 3. STREP EXPOSURE: "Has there been any exposure to strep within the past week?" If so, ask: "What type of contact occurred?"    no 4.  VIRAL SYMPTOMS: "Are there any symptoms of a cold, such as a runny nose, cough, hoarse voice or red eyes?"      cough 5. FEVER: "Do you have a fever?" If so, ask: "What is your temperature, how was it measured, and when did it start?"    unsure 6. PUS ON THE TONSILS: "Is there pus on the tonsils in the back of your throat?"    unsure 7. OTHER SYMPTOMS: "Do you have any other symptoms?" (e.g., difficulty breathing, headache, rash)    exhausted 8. PREGNANCY: "Is there any chance you are pregnant?" "When was your last menstrual period?"     no  Protocols used: SORE THROAT-A-AH

## 2017-11-15 NOTE — Telephone Encounter (Signed)
Copied from CRM [id number]. Topic: General - Other >> Nov 15, 2017  8:49 AM Gerrianne ScalePayne, Janeth Terry L wrote: Reason for CRM: patient isn't feeling any better green mucus out of mouth and nose pt states that english told her if she is no better to call for antibiotic

## 2017-11-16 NOTE — Telephone Encounter (Signed)
Green mucus, from her nose or coughing.  She came in for same day of symptoms.  This would make it day 5.  It is normal for a virus to worsen up until the 7-8th day.  Fever? Cough worse at night?  Sob or dyspnea, come in.

## 2017-11-16 NOTE — Telephone Encounter (Signed)
See previous message from TiptonStephanie

## 2017-12-16 ENCOUNTER — Ambulatory Visit: Payer: 59 | Admitting: Physician Assistant

## 2017-12-28 ENCOUNTER — Encounter: Payer: Self-pay | Admitting: Family Medicine

## 2017-12-28 ENCOUNTER — Ambulatory Visit: Payer: 59 | Admitting: Family Medicine

## 2017-12-28 ENCOUNTER — Ambulatory Visit (INDEPENDENT_AMBULATORY_CARE_PROVIDER_SITE_OTHER): Payer: 59 | Admitting: Family Medicine

## 2017-12-28 ENCOUNTER — Other Ambulatory Visit: Payer: Self-pay

## 2017-12-28 VITALS — BP 120/82 | HR 77 | Temp 97.6°F | Ht 66.5 in | Wt 220.0 lb

## 2017-12-28 DIAGNOSIS — J0100 Acute maxillary sinusitis, unspecified: Secondary | ICD-10-CM | POA: Diagnosis not present

## 2017-12-28 DIAGNOSIS — J309 Allergic rhinitis, unspecified: Secondary | ICD-10-CM | POA: Diagnosis not present

## 2017-12-28 DIAGNOSIS — H6121 Impacted cerumen, right ear: Secondary | ICD-10-CM

## 2017-12-28 MED ORDER — FLUTICASONE PROPIONATE 50 MCG/ACT NA SUSP: 1.0000 | Freq: Two times a day (BID) | NASAL | 1 refills | Status: DC

## 2017-12-28 MED ORDER — AMOXICILLIN-POT CLAVULANATE 875-125 MG PO TABS: 1.0000 | ORAL_TABLET | Freq: Two times a day (BID) | ORAL | 0 refills | Status: DC

## 2017-12-28 NOTE — Progress Notes (Signed)
2/19/20192:29 PM  Dominique Burton [birth year], 52 y.o. female [id number]  Chief Complaint  Patient presents with  . Sore Throat    has been feeling bad since January. Requesting z-pak    HPI:   Patient is a 52 y.o. female who presents today for over a week of right sided face pressure, thick nasal discharge, sore throat, mild cough, she has been having on and off clear rhinorrhea, sneezing, itchiness but this week it has been progressing. Denies any fever or chills but endorses malaise. She denies any nausea or vomiting, SOB. She works as a Runner, broadcasting/film/video.   Depression screen [facility] 2/9 12/28/2017 11/11/2017 06/03/2017  Decreased Interest 0 0 0  Down, Depressed, Hopeless 0 0 0  PHQ - 2 Score 0 0 0    No Known Allergies  Prior to Admission medications   Medication Sig Start Date End Date Taking? Authorizing Provider  lisinopril-hydrochlorothiazide (ZESTORETIC) 20-12.5 MG tablet Take 1 tablet by mouth daily. 11/11/17  Yes English, Judeth Cornfield D, PA  potassium chloride SA (K-DUR,KLOR-CON) 20 MEQ tablet Take 1 tablet (20 mEq total) by mouth daily. Must have office visit prior to refill 11/11/17  Yes English, [place] D, PA  Vitamin D, Ergocalciferol, (DRISDOL) 50000 units CAPS capsule Take 1 capsule (50,000 Units total) by mouth every 7 (seven) days. 10/16/16  Yes Bing Neighbors, FNP    Past Medical History:  Diagnosis Date  . Hypertension     Past Surgical History:  Procedure Laterality Date  . ABDOMINAL HYSTERECTOMY     PARTIAL  . CESAREAN SECTION      Social History   Tobacco Use  . Smoking status: Never Smoker  . Smokeless tobacco: Never Used  Substance Use Topics  . Alcohol use: No    Family History  Problem Relation Age of Onset  . Hypertension Mother   . Diabetes Father   . Hypertension Father   . Hypertension Sister   . Heart disease Brother   . Hypertension Brother   . Hypertension Maternal Grandmother   . Hypertension Maternal Grandfather     ROS Per  hpi  OBJECTIVE:  Blood pressure 120/82, pulse 77, temperature 97.6 F (36.4 C), temperature source Oral, height 5' 6.5" (1.689 m), weight 220 lb (99.8 kg), SpO2 99 %.  Physical Exam  Constitutional: She is oriented to person, place, and time and well-developed, well-nourished, and in no distress.  HENT:  Head: Normocephalic and atraumatic.  Right Ear: Hearing and external ear normal.  Left Ear: Hearing, tympanic membrane, external ear and ear canal normal.  Nose: Mucosal edema and rhinorrhea present. Right sinus exhibits maxillary sinus tenderness and frontal sinus tenderness. Left sinus exhibits no maxillary sinus tenderness and no frontal sinus tenderness.  Mouth/Throat: Oropharynx is clear and moist.  Right ear with impacted cerumen  Eyes: EOM are normal. Pupils are equal, round, and reactive to light.  Neck: Neck supple.  Cardiovascular: Normal rate, regular rhythm and normal heart sounds. Exam reveals no gallop and no friction rub.  No murmur heard. Pulmonary/Chest: Effort normal and breath sounds normal. She has no wheezes. She has no rales.  Lymphadenopathy:    She has no cervical adenopathy.  Neurological: She is alert and oriented to person, place, and time. Gait normal.  Skin: Skin is warm and dry.     ASSESSMENT and PLAN  1. Acute non-recurrent maxillary sinusitis 2. Allergic rhinitis, unspecified seasonality, unspecified trigger 3. Impacted cerumen of right ear Discussed supportive measures, new meds r/se/b and RTC precautions.  Patient educational handout given.   - amoxicillin-clavulanate (AUGMENTIN) 875-125 MG tablet; Take 1 tablet by mouth 2 (two) times daily. - fluticasone (FLONASE) 50 MCG/ACT nasal spray; Place 1 spray into both nostrils 2 (two) times daily.   Return if symptoms worsen or fail to improve.    Myles LippsIrma M Santiago, MD Primary Care at [facility] [address] [place], [place] [address] Ph.  [phone] Fax [phone]

## 2017-12-28 NOTE — Patient Instructions (Addendum)
1. Start over the counter oral antihistamine as generic claritin, zyrtec, allegra or xyzal 2. Start nasal saline washes twice a day    IF you received an x-ray today, you will receive an invoice from [facility]. Please contact [facility] at [phone] with questions or concerns regarding your invoice.   IF you received labwork today, you will receive an invoice from [facility]. Please contact [facility] at [phone] with questions or concerns regarding your invoice.   Our billing staff will not be able to assist you with questions regarding bills from these companies.  You will be contacted with the lab results as soon as they are available. The fastest way to get your results is to activate your My Chart account. Instructions are located on the last page of this paperwork. If you have not heard from us regarding the results in 2 weeks, please contact this office.     Sinusitis, Adult Sinusitis is soreness and inflammation of your sinuses. Sinuses are hollow spaces in the bones around your face. They are located:  Around your eyes.  In the middle of your forehead.  Behind your nose.  In your cheekbones.  Your sinuses and nasal passages are lined with a stringy fluid (mucus). Mucus normally drains out of your sinuses. When your nasal tissues get inflamed or swollen, the mucus can get trapped or blocked so air cannot flow through your sinuses. This lets bacteria, viruses, and funguses grow, and that leads to infection. Follow these instructions at home: Medicines  Take, use, or apply over-the-counter and prescription medicines only as told by your doctor. These may include nasal sprays.  If you were prescribed an antibiotic medicine, take it as told by your doctor. Do not stop taking the antibiotic even if you start to feel better. Hydrate and Humidify  Drink enough water to keep your pee (urine) clear or pale yellow.  Use a cool mist humidifier to keep the  humidity level in your home above 50%.  Breathe in steam for 10-15 minutes, 3-4 times a day or as told by your doctor. You can do this in the bathroom while a hot shower is running.  Try not to spend time in cool or dry air. Rest  Rest as much as possible.  Sleep with your head raised (elevated).  Make sure to get enough sleep each night. General instructions  Put a warm, moist washcloth on your face 3-4 times a day or as told by your doctor. This will help with discomfort.  Wash your hands often with soap and water. If there is no soap and water, use hand sanitizer.  Do not smoke. Avoid being around people who are smoking (secondhand smoke).  Keep all follow-up visits as told by your doctor. This is important. Contact a doctor if:  You have a fever.  Your symptoms get worse.  Your symptoms do not get better within 10 days. Get help right away if:  You have a very bad headache.  You cannot stop throwing up (vomiting).  You have pain or swelling around your face or eyes.  You have trouble seeing.  You feel confused.  Your neck is stiff.  You have trouble breathing. This information is not intended to replace advice given to you by your health care provider. Make sure you discuss any questions you have with your health care provider. Document Released: 04/13/2008 Document Revised: 06/21/2016 Document Reviewed: 08/21/2015 Elsevier Interactive Patient Education  [employer]2018 Elsevier Inc.

## 2018-01-03 ENCOUNTER — Other Ambulatory Visit: Payer: Self-pay | Admitting: Physician Assistant

## 2018-01-03 ENCOUNTER — Telehealth: Payer: Self-pay | Admitting: Physician Assistant

## 2018-01-03 NOTE — Telephone Encounter (Signed)
Copied from CRM [id number]. Topic: Quick Communication - See Telephone Encounter >> Jan 03, 2018  2:40 PM Guinevere FerrariMorris, Montez Stryker E, NT wrote: CRM for notification. See Telephone encounter for 01/03/18. Patient called and the said the doctor called her in the wrong prescription. Patient said she needs lisinopril-hydrochlorothiazide (ZESTORETIC) 20-25 [facility] [address] - [place] ([place]), [place] - [address] [phone] (Phone) Pt would also like a call back [phone] (Fax)    01/03/18.

## 2018-01-04 NOTE — Telephone Encounter (Signed)
Attempted to call pt.  Left voice message that the dosage of Lisinopril - Hydrochlorothiazide was decreased to 20-12.5 mg tablet (from 20-25 mg tablet) at office visit 06/03/17.  Noted there was a refill of the Lisinopril-HCTZ 20-12.5 mg on 11/11/17; #90; RF x 1.  Advised to call back if further questions.

## 2018-01-04 NOTE — Telephone Encounter (Signed)
Advise patient She should follow up

## 2018-01-04 NOTE — Telephone Encounter (Signed)
Pt called back and got the message from [place] (triage nurse) pt understand that the 20-12 was called in but she does not want those she wants the 20-25 she doesn't not want to take the 20-12.  She states that the 20-12 does not control it.

## 2018-01-06 ENCOUNTER — Other Ambulatory Visit: Payer: Self-pay | Admitting: Physician Assistant

## 2018-01-07 ENCOUNTER — Other Ambulatory Visit: Payer: Self-pay | Admitting: Physician Assistant

## 2018-01-07 NOTE — Telephone Encounter (Signed)
Left message on patient voicemail for her to return for follow up.

## 2018-01-08 NOTE — Telephone Encounter (Signed)
Pt arrived at office requesting lisinopril at higher dose.  Feels that this was discussed in January appt.  Advised that we l/m for her that provider would like her to make an appt.  Pt verbalized dissatisfaction with OV being required for medication.  Appt scheduled. Pt concerned with medication for weekend.  Recommended she continue taking medication as prescribed until appt.

## 2018-01-10 NOTE — Telephone Encounter (Signed)
[place] spoke with patient.  Refer to note.

## 2018-01-11 ENCOUNTER — Encounter: Payer: Self-pay | Admitting: Family Medicine

## 2018-01-11 ENCOUNTER — Other Ambulatory Visit: Payer: Self-pay

## 2018-01-11 ENCOUNTER — Ambulatory Visit (INDEPENDENT_AMBULATORY_CARE_PROVIDER_SITE_OTHER): Payer: 59 | Admitting: Family Medicine

## 2018-01-11 VITALS — BP 130/90 | HR 66 | Temp 97.7°F | Ht 66.5 in | Wt 216.6 lb

## 2018-01-11 DIAGNOSIS — Z1231 Encounter for screening mammogram for malignant neoplasm of breast: Secondary | ICD-10-CM

## 2018-01-11 DIAGNOSIS — I10 Essential (primary) hypertension: Secondary | ICD-10-CM | POA: Diagnosis not present

## 2018-01-11 DIAGNOSIS — E876 Hypokalemia: Secondary | ICD-10-CM

## 2018-01-11 DIAGNOSIS — E559 Vitamin D deficiency, unspecified: Secondary | ICD-10-CM | POA: Insufficient documentation

## 2018-01-11 MED ORDER — POTASSIUM CHLORIDE CRYS ER 20 MEQ PO TBCR
20.0000 meq | EXTENDED_RELEASE_TABLET | Freq: Every day | ORAL | 3 refills | Status: DC
Start: 2018-01-11 — End: 2018-10-26

## 2018-01-11 MED ORDER — LISINOPRIL-HYDROCHLOROTHIAZIDE 20-25 MG PO TABS
1.0000 | ORAL_TABLET | Freq: Every day | ORAL | 3 refills | Status: DC
Start: 2018-01-11 — End: 2018-10-26

## 2018-01-11 NOTE — Progress Notes (Signed)
3/5/20198:08 AM  Dominique HaysPaula Y Burton 01/13/1966, 52 y.o. female [id number]  Chief Complaint  Patient presents with  . Medication Problem    needs the right sosage of the Lisinpril 20-25    HPI:   Patient is a 52 y.o. female with past medical history significant for HTN and vitamin D deficiency who presents today requesting change in medication dose.  Patient has been on lisinopril-hctz 20-25mg  daily for over 20 years. Her BP has always been well controlled but her potassium would go low. She wanted to see if she could dc Hctz dose and therefore maybe not need to take KCL. However her DBP would go above 90 frequently. She therefore went back to hctz 25mg , the pharmacy was able to give her a 5 day rx until she was able to see us.  Otherwise she did receive letter, she is taking rx vitamin d, tolerating well  She is requesting referral for mammogram, last one more than 2 years ago, denies abnormal, denies breast sx  [facility] 2/9 01/11/2018 12/28/2017 11/11/2017  Decreased Interest 0 0 0  Down, Depressed, Hopeless 0 0 0  PHQ - 2 Score 0 0 0    No Known Allergies  Prior to Admission medications   Medication Sig Start Date End Date Taking? Authorizing Provider  fluticasone (FLONASE) 50 MCG/ACT nasal spray Place 1 spray into both nostrils 2 (two) times daily. 12/28/17  Yes Myles LippsSantiago, [place] Duan M, MD  potassium chloride SA (K-DUR,KLOR-CON) 20 MEQ tablet Take 1 tablet (20 mEq total) by mouth daily. Must have office visit prior to refill 11/11/17  Yes English, McGeheeStephanie D, PA  Vitamin D, Ergocalciferol, (DRISDOL) 50000 units CAPS capsule Take 1 capsule (50,000 Units total) by mouth every 7 (seven) days. 10/16/16  Yes Bing NeighborsHarris, Kimberly S, FNP  lisinopril-hydrochlorothiazide (ZESTORETIC) 20-12.5 MG tablet Take 1 tablet by mouth daily. Patient not taking: Reported on 01/11/2018 11/11/17   Garnetta BuddyEnglish, Stephanie D, PA    Past Medical History:  Diagnosis Date  . Hypertension     Past Surgical History:    Procedure Laterality Date  . ABDOMINAL HYSTERECTOMY     PARTIAL  . CESAREAN SECTION      Social History   Tobacco Use  . Smoking status: Never Smoker  . Smokeless tobacco: Never Used  Substance Use Topics  . Alcohol use: No    Family History  Problem Relation Age of Onset  . Hypertension Mother   . Diabetes Father   . Hypertension Father   . Hypertension Sister   . Heart disease Brother   . Hypertension Brother   . Hypertension Maternal Grandmother   . Hypertension Maternal Grandfather     Review of Systems  Constitutional: Negative for chills and fever.  Respiratory: Negative for cough and shortness of breath.   Cardiovascular: Negative for chest pain, palpitations and leg swelling.  Gastrointestinal: Negative for abdominal pain, nausea and vomiting.     OBJECTIVE:  Blood pressure 130/90, pulse 66, temperature 97.7 F (36.5 C), temperature source Oral, height 5' 6.5" (1.689 m), weight 216 lb 9.6 oz (98.2 kg), SpO2 98 %.  Physical Exam  Constitutional: She is oriented to person, place, and time and well-developed, well-nourished, and in no distress.  HENT:  Head: Normocephalic and atraumatic.  Mouth/Throat: Oropharynx is clear and moist. No oropharyngeal exudate.  Eyes: EOM are normal. Pupils are equal, round, and reactive to light. No scleral icterus.  Neck: Neck supple.  Cardiovascular: Normal rate, regular rhythm and normal heart sounds.  Exam reveals no gallop and no friction rub.  No murmur heard. Pulmonary/Chest: Effort normal and breath sounds normal. She has no wheezes. She has no rales.  Musculoskeletal: She exhibits no edema.  Neurological: She is alert and oriented to person, place, and time. Gait normal.  Skin: Skin is warm and dry.   Bmp on jan 2019, normal crt and K  ASSESSMENT and PLAN  1. Essential hypertension BP well controlled, cont current medication with supplementation of potassium - Basic Metabolic Panel; Future, recheck in 4  weeks - lisinopril-hydrochlorothiazide (PRINZIDE,ZESTORETIC) 20-25 MG tablet; Take 1 tablet by mouth daily. - potassium chloride SA (K-DUR,KLOR-CON) 20 MEQ tablet; Take 1 tablet (20 mEq total) by mouth daily.  2. Vitamin D deficiency Cont high dose vitamin d weekly,  - Vitamin D, 25-hydroxy; Future, recheck in 3 months  3. Visit for screening mammogram - MM DIGITAL SCREENING BILATERAL; Future  4. Hypokalemia Due to hctz.  - Basic Metabolic Panel; Future  Other orders   Return in about 3 months (around 04/13/2018).    Myles Lipps, MD Primary Care at [facility] [address] [place], [place] [address] Ph.  [phone] Fax [phone]

## 2018-01-11 NOTE — Patient Instructions (Signed)
     IF you received an x-ray today, you will receive an invoice from [facility]. Please contact [facility] at [phone] with questions or concerns regarding your invoice.   IF you received labwork today, you will receive an invoice from [facility]. Please contact [facility] at [phone] with questions or concerns regarding your invoice.   Our billing staff will not be able to assist you with questions regarding bills from these companies.  You will be contacted with the lab results as soon as they are available. The fastest way to get your results is to activate your My Chart account. Instructions are located on the last page of this paperwork. If you have not heard from us regarding the results in 2 weeks, please contact this office.     

## 2018-02-11 ENCOUNTER — Telehealth: Payer: Self-pay | Admitting: Family Medicine

## 2018-02-11 NOTE — Telephone Encounter (Signed)
Copied from CRM [id number]. Topic: Quick Communication - See Telephone Encounter >> Feb 11, 2018  8:03 AM Eston Mouldavis, Brasen Bundren B wrote: CRM for notification. See Telephone encounter for: 02/11/18.  PT is asking for Dr Leretha PolSantiago to call her concerning a test,  this is all the information the pt would give me,   she was going into work and had to go.  [phone]

## 2018-02-11 NOTE — Telephone Encounter (Signed)
Left detailed VM advising pt that I need reason for call.

## 2018-02-16 ENCOUNTER — Encounter: Payer: Self-pay | Admitting: Physician Assistant

## 2018-03-01 ENCOUNTER — Encounter: Payer: Self-pay | Admitting: Family Medicine

## 2018-03-01 ENCOUNTER — Ambulatory Visit (INDEPENDENT_AMBULATORY_CARE_PROVIDER_SITE_OTHER): Payer: 59 | Admitting: Family Medicine

## 2018-03-01 ENCOUNTER — Other Ambulatory Visit: Payer: Self-pay

## 2018-03-01 VITALS — BP 124/70 | HR 76 | Temp 98.5°F | Ht 66.5 in | Wt 217.0 lb

## 2018-03-01 DIAGNOSIS — I10 Essential (primary) hypertension: Secondary | ICD-10-CM

## 2018-03-01 DIAGNOSIS — Z862 Personal history of diseases of the blood and blood-forming organs and certain disorders involving the immune mechanism: Secondary | ICD-10-CM

## 2018-03-01 DIAGNOSIS — E559 Vitamin D deficiency, unspecified: Secondary | ICD-10-CM

## 2018-03-01 DIAGNOSIS — R5383 Other fatigue: Secondary | ICD-10-CM | POA: Diagnosis not present

## 2018-03-01 DIAGNOSIS — E876 Hypokalemia: Secondary | ICD-10-CM

## 2018-03-01 DIAGNOSIS — F5089 Other specified eating disorder: Secondary | ICD-10-CM

## 2018-03-01 DIAGNOSIS — R631 Polydipsia: Secondary | ICD-10-CM | POA: Diagnosis not present

## 2018-03-01 NOTE — Patient Instructions (Signed)
     IF you received an x-ray today, you will receive an invoice from [facility]. Please contact [facility] at [phone] with questions or concerns regarding your invoice.   IF you received labwork today, you will receive an invoice from [facility]. Please contact [facility] at [phone] with questions or concerns regarding your invoice.   Our billing staff will not be able to assist you with questions regarding bills from these companies.  You will be contacted with the lab results as soon as they are available. The fastest way to get your results is to activate your My Chart account. Instructions are located on the last page of this paperwork. If you have not heard from us regarding the results in 2 weeks, please contact this office.     

## 2018-03-01 NOTE — Progress Notes (Signed)
4/23/20195:32 PM  Dominique Burton 09/09/1966, 52 y.o. female [id number]  Chief Complaint  Patient presents with  . Fatigue    wanting vit D and iron chkd, feeling as though energy is not where it should be for the past 2 wks    HPI:   Patient is a 52 y.o. female with past medical history significant for HTN, hypokalemia and vitamin D who presents today for concerns of fatigue, odd cravings and increased thirst for past several weeks.  She has a h/o iron def anemia 2/2 menorrhagia, but that has been corrected for years now However she is craving liver, read meat, ice, foods that she normally does not crave She is also feeling drained Her last labs in Jan 2019 were unremarkable   Depression screen [facility] 2/9 03/01/2018 01/11/2018 12/28/2017  Decreased Interest 0 0 0  Down, Depressed, Hopeless 0 0 0  PHQ - 2 Score 0 0 0    No Known Allergies  Prior to Admission medications   Medication Sig Start Date End Date Taking? Authorizing Provider  fluticasone (FLONASE) 50 MCG/ACT nasal spray Place 1 spray into both nostrils 2 (two) times daily. 12/28/17  Yes Myles Lipps, MD  lisinopril-hydrochlorothiazide (PRINZIDE,ZESTORETIC) 20-25 MG tablet Take 1 tablet by mouth daily. 01/11/18  Yes Myles Lipps, MD  potassium chloride SA (K-DUR,KLOR-CON) 20 MEQ tablet Take 1 tablet (20 mEq total) by mouth daily. 01/11/18  Yes Myles Lipps, MD  Vitamin D, Ergocalciferol, (DRISDOL) 50000 units CAPS capsule Take 1 capsule (50,000 Units total) by mouth every 7 (seven) days. 10/16/16  Yes Bing Neighbors, FNP    Past Medical History:  Diagnosis Date  . Hypertension     Past Surgical History:  Procedure Laterality Date  . ABDOMINAL HYSTERECTOMY     PARTIAL  . CESAREAN SECTION      Social History   Tobacco Use  . Smoking status: Never Smoker  . Smokeless tobacco: Never Used  Substance Use Topics  . Alcohol use: No    Family History  Problem Relation Age of Onset  . Hypertension Mother    . Diabetes Father   . Hypertension Father   . Hypertension Sister   . Heart disease Brother   . Hypertension Brother   . Hypertension Maternal Grandmother   . Hypertension Maternal Grandfather     Review of Systems  Constitutional: Positive for malaise/fatigue and weight loss. Negative for chills, diaphoresis and fever.  HENT: Negative for ear pain, sinus pain and sore throat.   Respiratory: Negative for cough and shortness of breath.   Cardiovascular: Negative for chest pain, palpitations and leg swelling.  Gastrointestinal: Negative for abdominal pain, nausea and vomiting.  Genitourinary: Positive for frequency. Negative for dysuria and urgency.  Musculoskeletal: Negative for myalgias.  Neurological: Positive for dizziness. Negative for headaches.  Endo/Heme/Allergies: Positive for polydipsia.  Psychiatric/Behavioral: Negative for depression. The patient is not nervous/anxious.      OBJECTIVE:  Blood pressure 124/70, pulse 76, temperature 98.5 F (36.9 C), temperature source Oral, height 5' 6.5" (1.689 m), weight 217 lb (98.4 kg), SpO2 97 %  Wt Readings from Last 3 Encounters:  03/01/18 217 lb (98.4 kg)  01/11/18 216 lb 9.6 oz (98.2 kg)  12/28/17 220 lb (99.8 kg)  .  Physical Exam  Constitutional: She is oriented to person, place, and time. She appears well-developed and well-nourished.  HENT:  Head: Normocephalic and atraumatic.  Mouth/Throat: Oropharynx is clear and moist. No oropharyngeal exudate.  Eyes: Pupils  are equal, round, and reactive to light. Conjunctivae and EOM are normal. No scleral icterus.  Neck: Neck supple. No thyromegaly present.  Cardiovascular: Normal rate, regular rhythm and normal heart sounds. Exam reveals no gallop and no friction rub.  No murmur heard. Pulmonary/Chest: Effort normal and breath sounds normal. She has no wheezes. She has no rales.  Musculoskeletal: She exhibits no edema.  Neurological: She is alert and oriented to person,  place, and time.  Skin: Skin is warm and dry.  Psychiatric: She has a normal mood and affect.     ASSESSMENT and PLAN  1. Fatigue, unspecified type Exam reassuring, discussed pushing fluids, healthy lifestyle, labs per below. BP at goal. RTC precautions reviewed. - TSH - CBC - HgB A1c  2. Pica in adults - CBC - Ferritin  3. H/O iron deficiency anemia - CBC - Ferritin  4. Increased thirst - HgB A1c  5. Vitamin D deficiency - Vitamin D, 25-hydroxy  6. Essential hypertension  7. Hypokalemia - Basic metabolic panel  Return if symptoms worsen or fail to improve.    Myles LippsIrma M Santiago, MD [facility] at [facility] [address] [place], [place] [address] Ph.  [phone] Fax [phone]

## 2018-03-02 ENCOUNTER — Other Ambulatory Visit: Payer: Self-pay | Admitting: Family Medicine

## 2018-03-02 LAB — CBC
Hematocrit: 37.6 % (ref 34.0–46.6)
Hemoglobin: 13 g/dL (ref 11.1–15.9)
MCH: 29.5 pg (ref 26.6–33.0)
MCHC: 34.6 g/dL (ref 31.5–35.7)
MCV: 85 fL (ref 79–97)
Platelets: 252 10*3/uL (ref 150–379)
RBC: 4.41 x10E6/uL (ref 3.77–5.28)
RDW: 14.3 % (ref 12.3–15.4)
WBC: 6.9 10*3/uL (ref 3.4–10.8)

## 2018-03-02 LAB — BASIC METABOLIC PANEL
BUN/Creatinine Ratio: 14 (ref 9–23)
BUN: 11 mg/dL (ref 6–24)
CO2: 27 mmol/L (ref 20–29)
Calcium: 9.4 mg/dL (ref 8.7–10.2)
Chloride: 101 mmol/L (ref 96–106)
Creatinine, Ser: 0.79 mg/dL (ref 0.57–1.00)
GFR calc Af Amer: 99 mL/min/{1.73_m2} (ref >59)
GFR calc non Af Amer: 86 mL/min/{1.73_m2} (ref >59)
Glucose: 83 mg/dL (ref 65–99)
Potassium: 3.5 mmol/L (ref 3.5–5.2)
Sodium: 142 mmol/L (ref 134–144)

## 2018-03-02 LAB — FERRITIN: Ferritin: 158 ng/mL — ABNORMAL HIGH (ref 15–150)

## 2018-03-02 LAB — TSH: TSH: 1.19 u[IU]/mL (ref 0.450–4.500)

## 2018-03-02 LAB — VITAMIN D 25 HYDROXY (VIT D DEFICIENCY, FRACTURES): Vit D, 25-Hydroxy: 29.8 ng/mL — ABNORMAL LOW (ref 30.0–100.0)

## 2018-03-07 ENCOUNTER — Other Ambulatory Visit: Payer: Self-pay | Admitting: Family Medicine

## 2018-03-07 DIAGNOSIS — R5383 Other fatigue: Secondary | ICD-10-CM

## 2018-03-07 DIAGNOSIS — R631 Polydipsia: Secondary | ICD-10-CM

## 2018-03-08 LAB — HEMOGLOBIN A1C
Est. average glucose Bld gHb Est-mCnc: 117 mg/dL
Hgb A1c MFr Bld: 5.7 % — ABNORMAL HIGH (ref 4.8–5.6)

## 2018-03-08 NOTE — Addendum Note (Signed)
Addended by: Geoffery Spruce on: 03/08/2018 09:14 AM   Modules accepted: Orders

## 2018-03-16 ENCOUNTER — Ambulatory Visit: Payer: 59 | Admitting: Family Medicine

## 2018-03-17 ENCOUNTER — Ambulatory Visit: Payer: 59 | Admitting: Family Medicine

## 2018-03-17 ENCOUNTER — Ambulatory Visit: Payer: Managed Care, Other (non HMO)

## 2018-03-25 ENCOUNTER — Ambulatory Visit: Payer: Self-pay

## 2018-04-01 ENCOUNTER — Ambulatory Visit (INDEPENDENT_AMBULATORY_CARE_PROVIDER_SITE_OTHER): Payer: 59 | Admitting: Family Medicine

## 2018-04-01 ENCOUNTER — Encounter: Payer: Self-pay | Admitting: Family Medicine

## 2018-04-01 ENCOUNTER — Other Ambulatory Visit: Payer: Self-pay

## 2018-04-01 VITALS — BP 140/90 | HR 66 | Temp 97.4°F | Ht 67.0 in | Wt 216.0 lb

## 2018-04-01 DIAGNOSIS — M25511 Pain in right shoulder: Secondary | ICD-10-CM

## 2018-04-01 DIAGNOSIS — S46811D Strain of other muscles, fascia and tendons at shoulder and upper arm level, right arm, subsequent encounter: Secondary | ICD-10-CM

## 2018-04-01 DIAGNOSIS — M5410 Radiculopathy, site unspecified: Secondary | ICD-10-CM

## 2018-04-01 NOTE — Patient Instructions (Signed)
     IF you received an x-ray today, you will receive an invoice from [facility]. Please contact [facility] at [phone] with questions or concerns regarding your invoice.   IF you received labwork today, you will receive an invoice from [facility]. Please contact [facility] at [phone] with questions or concerns regarding your invoice.   Our billing staff will not be able to assist you with questions regarding bills from these companies.  You will be contacted with the lab results as soon as they are available. The fastest way to get your results is to activate your My Chart account. Instructions are located on the last page of this paperwork. If you have not heard from us regarding the results in 2 weeks, please contact this office.     

## 2018-04-01 NOTE — Progress Notes (Signed)
5/24/20199:47 AM  Dominique Burton [birth year], 52 y.o. female [id number]  Chief Complaint  Patient presents with  . Pain    Right shoulder pain with the pain radiating to the right thumb    HPI:   Patient is a 52 y.o. female who presents today for right shoulder pain radiating down to her thumb  Patient is right handed Pain started after picking up an about 30 lbs child at her work on may 7th She was seen by workmen comp provider on the 11th and again on the 17th She was treated conservatively and referred to PT after the visit in the 17th She was placed on left handed duties only She has a followup appt on the 25th She was diagnosed with muscle strain She feels overall she is doing 25-50% better, she is able to move her shoulder more It does not radiate down to her thumb as much She is very frustrated with the timing of the referral and healing process She does not understand why she cant start PT immediately as she was not given an appt until 2 weeks from now She brings me records for review  Fall Risk  04/01/2018 04/01/2018 03/01/2018 01/11/2018 12/28/2017  Falls in the past year? No No No No No     Depression screen [facility] 2/9 04/01/2018 04/01/2018 03/01/2018  Decreased Interest 0 0 0  Down, Depressed, Hopeless 0 0 0  PHQ - 2 Score 0 0 0    No Known Allergies  Prior to Admission medications   Medication Sig Start Date End Date Taking? Authorizing Provider  cyclobenzaprine (FLEXERIL) 5 MG tablet Take 5 mg by mouth once. 03/16/18  Yes [provider]  fluticasone (FLONASE) 50 MCG/ACT nasal spray Place 1 spray into both nostrils 2 (two) times daily. 12/28/17  Yes Myles Lipps, MD  lisinopril-hydrochlorothiazide (PRINZIDE,ZESTORETIC) 20-25 MG tablet Take 1 tablet by mouth daily. 01/11/18  Yes Myles Lipps, MD  potassium chloride SA (K-DUR,KLOR-CON) 20 MEQ tablet Take 1 tablet (20 mEq total) by mouth daily. 01/11/18  Yes Myles Lipps, MD  Vitamin D, Ergocalciferol,  (DRISDOL) 50000 units CAPS capsule Take 1 capsule (50,000 Units total) by mouth every 7 (seven) days. 10/16/16  Yes Bing Neighbors, FNP    Past Medical History:  Diagnosis Date  . Hypertension   . Vitamin D deficiency     Past Surgical History:  Procedure Laterality Date  . ABDOMINAL HYSTERECTOMY     PARTIAL  . CESAREAN SECTION      Social History   Tobacco Use  . Smoking status: Never Smoker  . Smokeless tobacco: Never Used  Substance Use Topics  . Alcohol use: No    Family History  Problem Relation Age of Onset  . Hypertension Mother   . Diabetes Father   . Hypertension Father   . Hypertension Sister   . Heart disease Brother   . Hypertension Brother   . Hypertension Maternal Grandmother   . Hypertension Maternal Grandfather     ROS Per hpi  OBJECTIVE:  Blood pressure 140/90, pulse 66, temperature (!) 97.4 F (36.3 C), temperature source Oral, height  (1.702 m), weight 216 lb (98 kg), SpO2 98 %.  Physical Exam  Constitutional: She is oriented to person, place, and time. She appears well-developed and well-nourished.  HENT:  Head: Normocephalic and atraumatic.  Mouth/Throat: Mucous membranes are normal.  Eyes: Pupils are equal, round, and reactive to light. Conjunctivae and EOM are normal. No scleral icterus.  Neck: Neck supple.  Pulmonary/Chest: Effort normal.  Neurological: She is alert and oriented to person, place, and time.  Skin: Skin is warm and dry.  Psychiatric: Her mood appears anxious.  Nursing note and vitals reviewed.   ASSESSMENT and PLAN  1. Right shoulder pain, unspecified chronicity - Ambulatory referral to Physical Therapy  2. Strain of other muscles, fascia and tendons at shoulder and upper arm level, right arm, subsequent encounter - Ambulatory referral to Physical Therapy  3. Radiculopathy of arm - Ambulatory referral to Physical Therapy  More then 50% of this 25 minutes visit was spent in counseling and coordination  of care. Reassured patient that care received has been appropriate, discussed expectations regarding healing from a muscle strain. Did provide a referral to PT but informed patient that she would probably not be seen sooner than the appt she already has. I strongly advised she keep followup appt with workmen comp provider.  Return if symptoms worsen or fail to improve.    Myles Lipps, MD Primary Care at [facility] [address] [place], [place] [address] Ph.  [phone] Fax [phone]

## 2018-04-07 ENCOUNTER — Ambulatory Visit: Payer: 59 | Admitting: Family Medicine

## 2018-04-13 ENCOUNTER — Ambulatory Visit: Payer: Self-pay

## 2018-04-13 IMAGING — CR DG CHEST 2V
2 series · 2 of 2 positions shown · non-contrast
Comparison: None.

CLINICAL DATA: Left-sided chest pain, pressure

EXAM:
CHEST  2 VIEW

[w chest pa]
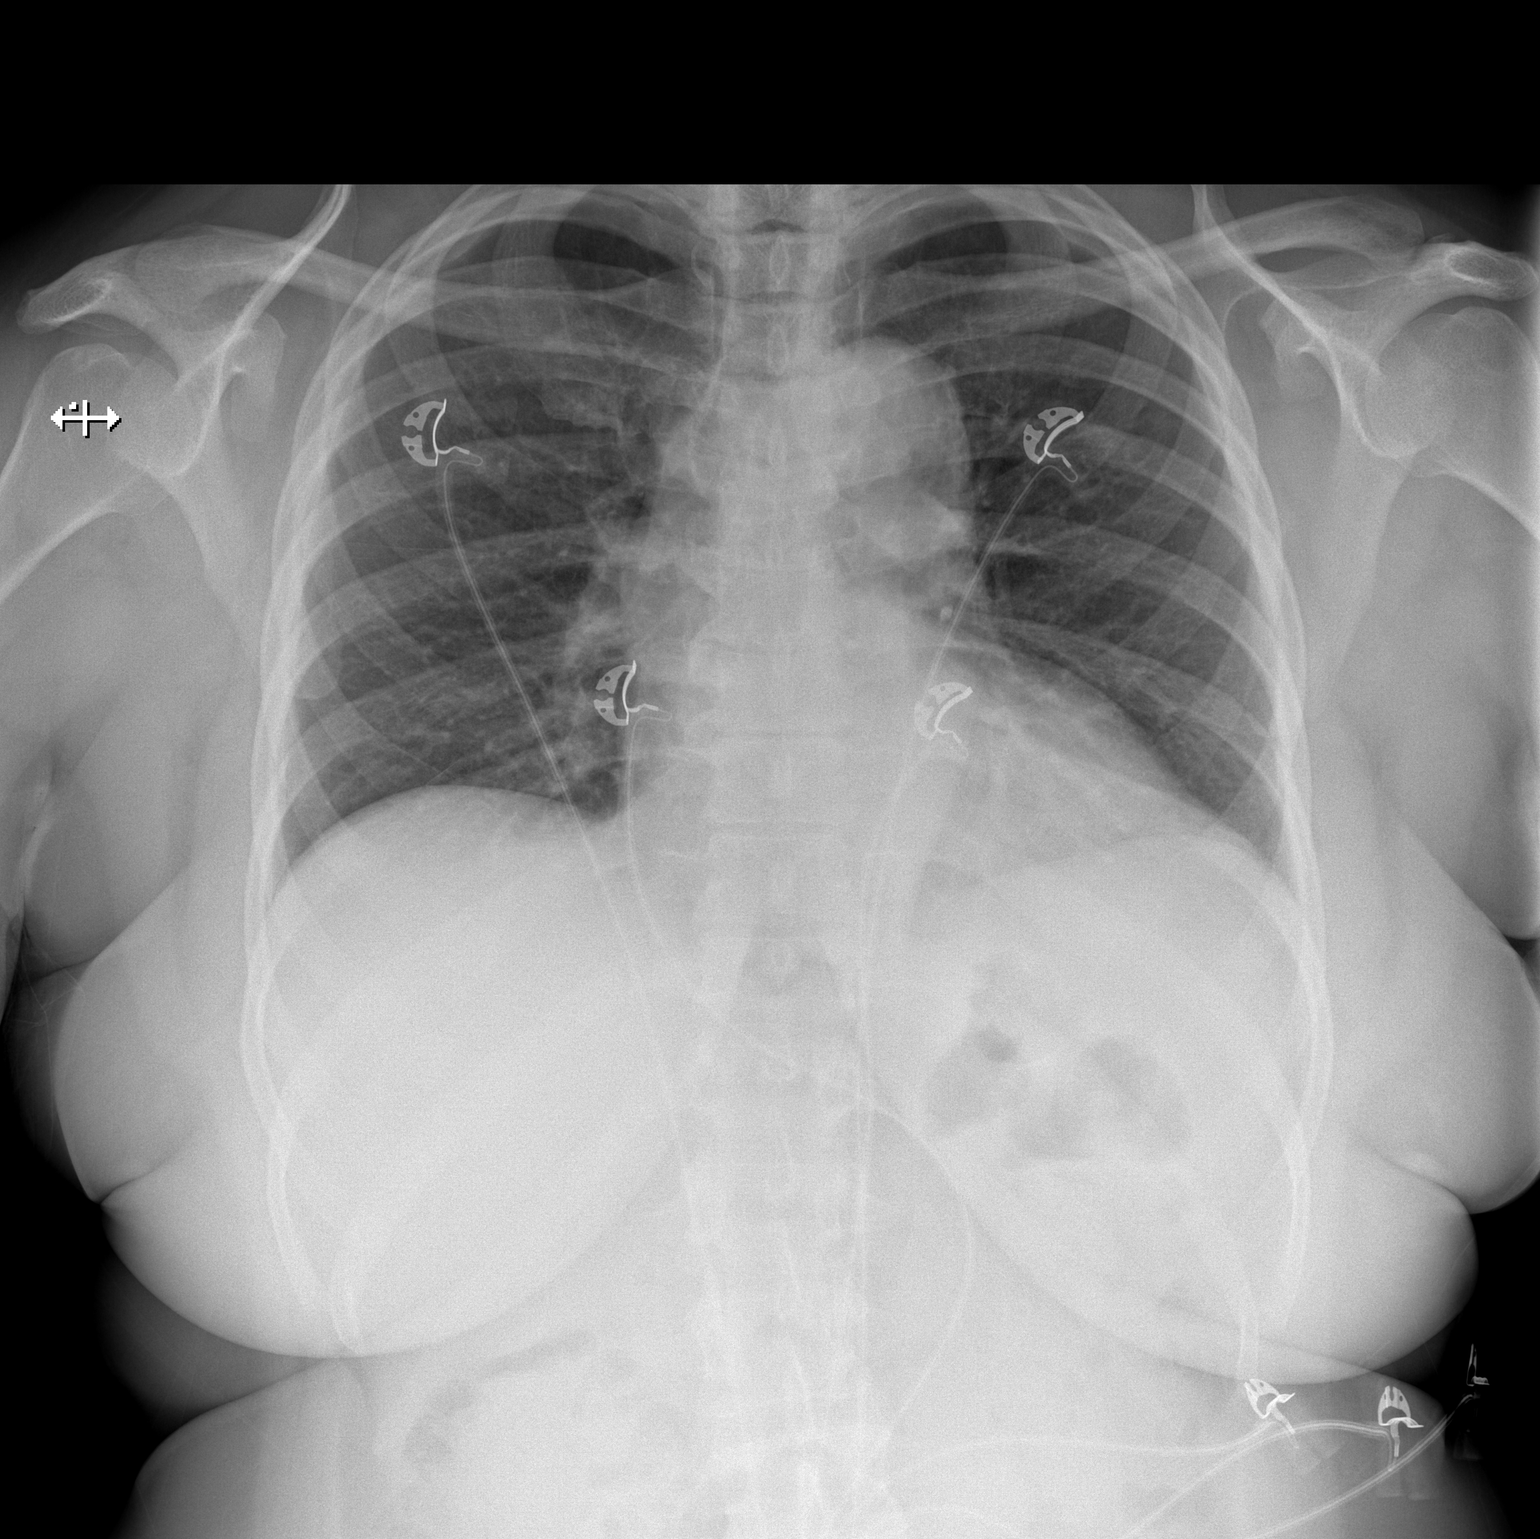

[w chest lat]
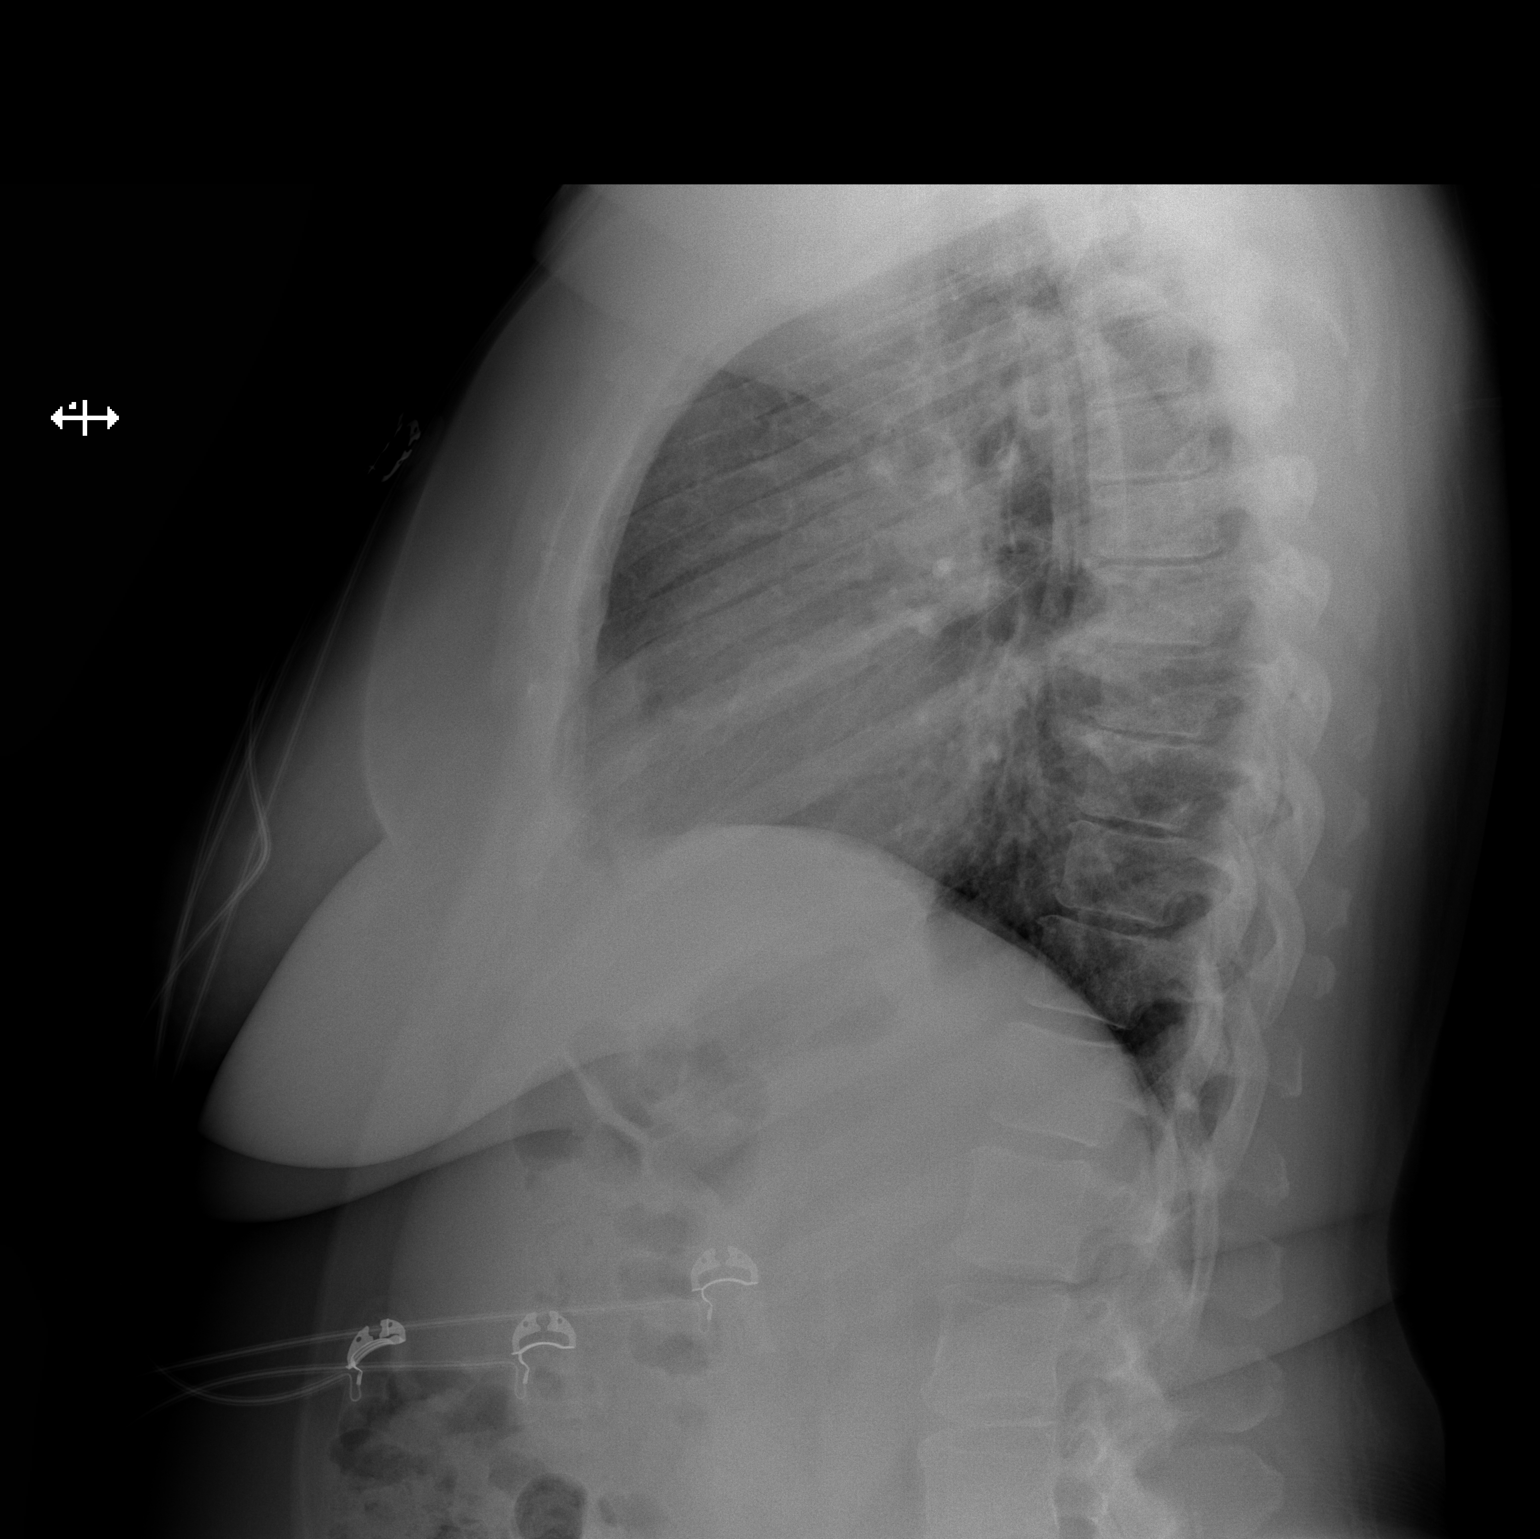

[2 of 2 positions shown; findings below may reference images not displayed]

FINDINGS: Lungs are clear.  No pleural effusion or pneumothorax.

The heart is normal in size.

Mild degenerative changes of the visualized thoracolumbar spine.
IMPRESSION: Normal chest radiographs.

## 2018-04-21 ENCOUNTER — Ambulatory Visit: Payer: 59 | Admitting: Family Medicine

## 2018-04-23 ENCOUNTER — Other Ambulatory Visit: Payer: Self-pay

## 2018-04-23 ENCOUNTER — Ambulatory Visit (INDEPENDENT_AMBULATORY_CARE_PROVIDER_SITE_OTHER): Payer: 59 | Admitting: Family Medicine

## 2018-04-23 ENCOUNTER — Encounter: Payer: Self-pay | Admitting: Family Medicine

## 2018-04-23 ENCOUNTER — Ambulatory Visit: Payer: 59 | Admitting: Family Medicine

## 2018-04-23 VITALS — BP 122/80 | HR 70 | Temp 97.8°F | Ht 66.14 in | Wt 224.2 lb

## 2018-04-23 DIAGNOSIS — M25511 Pain in right shoulder: Secondary | ICD-10-CM

## 2018-04-23 DIAGNOSIS — S46811D Strain of other muscles, fascia and tendons at shoulder and upper arm level, right arm, subsequent encounter: Secondary | ICD-10-CM

## 2018-04-23 DIAGNOSIS — M5410 Radiculopathy, site unspecified: Secondary | ICD-10-CM | POA: Diagnosis not present

## 2018-04-23 NOTE — Progress Notes (Signed)
6/15/201910:03 AM  Dominique HaysPaula Y Burton 09/06/1966, [age] y.o. female [id number]  Chief Complaint  Patient presents with  . Follow-up    right shoulder has improved, not as much pain at all    HPI:   Patient is a 52 y.o. female with past medical history significant for right upper back strain with radiculopathy who presents today for followup  This happened on may 7th while at work She has been seeing a different provider for workmen's comp  He had placed her on light duty but given that her employer could not accommodate she has been out on FMLA She last saw this provider on 04/05/18, brings in notes for review She has not started PT She feels much better, able to move her shoulder without any pain She does hear some occasional popping and cracking that she does not recall being there before She still reports some occasional mild numbness of right thumb She denies any weakness She feels ready to return to work and is requesting release She still plans to go to PT  Fall Risk  04/23/2018 04/01/2018 04/01/2018 03/01/2018 01/11/2018  Falls in the past year? No No No No No     [facility] 2/9 04/23/2018 04/01/2018 04/01/2018  Decreased Interest 0 0 0  Down, Depressed, Hopeless 0 0 0  PHQ - 2 Score 0 0 0    No Known Allergies  Prior to Admission medications   Medication Sig Start Date End Date Taking? Authorizing Provider  lisinopril-hydrochlorothiazide (PRINZIDE,ZESTORETIC) 20-25 MG tablet Take 1 tablet by mouth daily. 01/11/18  Yes Myles LippsSantiago, Lashanda Storlie M, MD  potassium chloride SA (K-DUR,KLOR-CON) 20 MEQ tablet Take 1 tablet (20 mEq total) by mouth daily. 01/11/18  Yes Myles LippsSantiago, Robertine Kipper M, MD  Vitamin D, Ergocalciferol, (DRISDOL) 50000 units CAPS capsule Take 1 capsule (50,000 Units total) by mouth every 7 (seven) days. 10/16/16  Yes Bing NeighborsHarris, Kimberly S, FNP  cyclobenzaprine (FLEXERIL) 5 MG tablet Take 5 mg by mouth once. 03/16/18   [provider]  fluticasone (FLONASE) 50 MCG/ACT nasal  spray Place 1 spray into both nostrils 2 (two) times daily. Patient not taking: Reported on 04/23/2018 12/28/17   Myles LippsSantiago, Thelbert Gartin M, MD    Past Medical History:  Diagnosis Date  . Hypertension   . Vitamin D deficiency     Past Surgical History:  Procedure Laterality Date  . ABDOMINAL HYSTERECTOMY     PARTIAL  . CESAREAN SECTION      Social History   Tobacco Use  . Smoking status: Never Smoker  . Smokeless tobacco: Never Used  Substance Use Topics  . Alcohol use: No    Family History  Problem Relation Age of Onset  . Hypertension Mother   . Diabetes Father   . Hypertension Father   . Hypertension Sister   . Heart disease Brother   . Hypertension Brother   . Hypertension Maternal Grandmother   . Hypertension Maternal Grandfather     ROS Per hpi  OBJECTIVE:  Blood pressure 122/80, pulse 70, temperature 97.8 F (36.6 C), temperature source Oral, height 5' 6.14" (1.68 m), weight 224 lb 3.2 oz (101.7 kg), SpO2 99 %.  Physical Exam  Constitutional: She appears well-developed and well-nourished.  Neck: Neck supple. No spinous process tenderness and no muscular tenderness present.  Musculoskeletal:       Right shoulder: She exhibits normal range of motion, no tenderness and no bony tenderness.  Neg empty can and hawkins   Neurological: She has normal strength and  normal reflexes. No sensory deficit.  Nursing note and vitals reviewed.    ASSESSMENT and PLAN  1. Right shoulder pain, unspecified chronicity 2. Strain of other muscles, fascia and tendons at shoulder and upper arm level, right arm, subsequent encounter 3. Radiculopathy of arm Significantly improved. Restrictions lifted except for lifting above 50lbs. She is able to return to work.   Return in about 2 weeks (around 05/07/2018).    Myles Lipps, MD Primary Care at [facility] [address] [place], [place] [address] Ph.  [phone] Fax [phone]

## 2018-04-23 NOTE — Patient Instructions (Signed)
     IF you received an x-ray today, you will receive an invoice from [facility]. Please contact [facility] at [phone] with questions or concerns regarding your invoice.   IF you received labwork today, you will receive an invoice from [facility]. Please contact [facility] at [phone] with questions or concerns regarding your invoice.   Our billing staff will not be able to assist you with questions regarding bills from these companies.  You will be contacted with the lab results as soon as they are available. The fastest way to get your results is to activate your My Chart account. Instructions are located on the last page of this paperwork. If you have not heard from us regarding the results in 2 weeks, please contact this office.     

## 2018-04-25 ENCOUNTER — Telehealth: Payer: Self-pay | Admitting: Family Medicine

## 2018-04-25 NOTE — Telephone Encounter (Signed)
Please call pt at [phone] with any questions and when it is ready to be picked up.  Thank you!

## 2018-04-25 NOTE — Telephone Encounter (Signed)
Pt dropped off paperwork. Put in provider box fr

## 2018-04-26 ENCOUNTER — Telehealth: Payer: Self-pay | Admitting: Family Medicine

## 2018-04-26 NOTE — Telephone Encounter (Signed)
Copied from CRM [id number]. Topic: Inquiry >> Apr 26, 2018  4:43 PM Terisa Starraylor, Brittany L wrote: Reason for CRM: Patient wants Lowella Bandyikki to call her back , she would not give me any further info.   THIS PPW WILL BE ON CAITLIN'S DESK

## 2018-04-27 NOTE — Telephone Encounter (Signed)
done

## 2018-04-28 NOTE — Telephone Encounter (Signed)
Paperwork was not for FMLA/Disability I have placed the forms in the scan box at 104. [facility].

## 2018-05-10 ENCOUNTER — Ambulatory Visit: Payer: Self-pay

## 2018-06-10 ENCOUNTER — Telehealth: Payer: Self-pay | Admitting: Family Medicine

## 2018-06-13 ENCOUNTER — Ambulatory Visit: Payer: 59 | Admitting: Family Medicine

## 2018-06-15 ENCOUNTER — Ambulatory Visit: Payer: 59 | Admitting: Family Medicine

## 2018-06-17 ENCOUNTER — Ambulatory Visit
Admission: RE | Admit: 2018-06-17 | Discharge: 2018-06-17 | Disposition: A | Discharge status: Home / Self Care | Payer: Medicare Other | Source: Ambulatory Visit | Attending: Family Medicine | Admitting: Family Medicine

## 2018-06-17 DIAGNOSIS — Z1231 Encounter for screening mammogram for malignant neoplasm of breast: Secondary | ICD-10-CM

## 2018-06-20 ENCOUNTER — Other Ambulatory Visit: Payer: Self-pay

## 2018-06-20 ENCOUNTER — Encounter: Payer: Self-pay | Admitting: Family Medicine

## 2018-06-20 ENCOUNTER — Ambulatory Visit (INDEPENDENT_AMBULATORY_CARE_PROVIDER_SITE_OTHER): Payer: 59 | Admitting: Family Medicine

## 2018-06-20 VITALS — BP 126/80 | HR 72 | Temp 98.7°F | Resp 18 | Ht 66.14 in | Wt 226.0 lb

## 2018-06-20 DIAGNOSIS — R7989 Other specified abnormal findings of blood chemistry: Secondary | ICD-10-CM

## 2018-06-20 DIAGNOSIS — R252 Cramp and spasm: Secondary | ICD-10-CM | POA: Diagnosis not present

## 2018-06-20 DIAGNOSIS — R300 Dysuria: Secondary | ICD-10-CM

## 2018-06-20 DIAGNOSIS — I10 Essential (primary) hypertension: Secondary | ICD-10-CM | POA: Diagnosis not present

## 2018-06-20 DIAGNOSIS — E559 Vitamin D deficiency, unspecified: Secondary | ICD-10-CM

## 2018-06-20 DIAGNOSIS — S76011A Strain of muscle, fascia and tendon of right hip, initial encounter: Secondary | ICD-10-CM

## 2018-06-20 DIAGNOSIS — Z8639 Personal history of other endocrine, nutritional and metabolic disease: Secondary | ICD-10-CM

## 2018-06-20 LAB — POCT URINALYSIS DIP (MANUAL ENTRY)
Bilirubin, UA: NEGATIVE
Glucose, UA: NEGATIVE mg/dL
Ketones, POC UA: NEGATIVE mg/dL
Leukocytes, UA: NEGATIVE
Nitrite, UA: NEGATIVE
Protein Ur, POC: NEGATIVE mg/dL
Spec Grav, UA: 1.02 (ref 1.010–1.025)
Urobilinogen, UA: 0.2 E.U./dL
pH, UA: 6.5 (ref 5.0–8.0)

## 2018-06-20 NOTE — Progress Notes (Signed)
8/12/201911:49 AM  Dominique Burton 05/06/1966, 52 y.o. female [id number]  Chief Complaint  Patient presents with  . thigh    right thigh spasms   . Dysuria    wants to be checked for uti     HPI:   Patient is a 52 y.o. female with past medical history significant for HTN, vitamin D deficiency,  who presents today for dysuria and muscle cramps  Having intermittent dysuria, having no hematuria or discharge or odor Feeling vaginal dryness Postmenopausal  Also ever since her work related injuries several months ago  She has been having right thigh cramps/spasms at least once a day Mostly when walking She is wondering about her electrolytes and vitamin d as these have been low in the past She would also like to have ferritin rechecked as it was a bit elevated last check in April 2019  Fall Risk  04/23/2018 04/01/2018 04/01/2018 03/01/2018 01/11/2018  Falls in the past year? No No No No No     [facility] 04/23/2018 04/01/2018 04/01/2018  Decreased Interest 0 0 0  Down, Depressed, Hopeless 0 0 0  PHQ - 2 Score 0 0 0    No Known Allergies  Prior to Admission medications   Medication Sig Start Date End Date Taking? Authorizing Provider  lisinopril-hydrochlorothiazide (PRINZIDE,ZESTORETIC) 20-25 MG tablet Take 1 tablet by mouth daily. 01/11/18  Yes Myles LippsSantiago, Sophiamarie Nease M, MD  potassium chloride SA (K-DUR,KLOR-CON) 20 MEQ tablet Take 1 tablet (20 mEq total) by mouth daily. 01/11/18  Yes Myles LippsSantiago, Raynaldo Falco M, MD  Vitamin D, Ergocalciferol, (DRISDOL) 50000 units CAPS capsule Take 1 capsule (50,000 Units total) by mouth every 7 (seven) days. 10/16/16  Yes Bing NeighborsHarris, Kimberly S, FNP  cyclobenzaprine (FLEXERIL) 5 MG tablet Take 5 mg by mouth once. 03/16/18   [provider]    Past Medical History:  Diagnosis Date  . Hypertension   . Vitamin D deficiency     Past Surgical History:  Procedure Laterality Date  . ABDOMINAL HYSTERECTOMY     PARTIAL  . CESAREAN SECTION      Social  History   Tobacco Use  . Smoking status: Never Smoker  . Smokeless tobacco: Never Used  Substance Use Topics  . Alcohol use: No    Family History  Problem Relation Age of Onset  . Hypertension Mother   . Diabetes Father   . Hypertension Father   . Hypertension Sister   . Heart disease Brother   . Hypertension Brother   . Hypertension Maternal Grandmother   . Hypertension Maternal Grandfather     ROS Per hpi  OBJECTIVE:  Blood pressure 126/80, pulse 72, temperature 98.7 F (37.1 C), temperature source Oral, resp. rate 18, height 5' 6.14" (1.68 m), weight 226 lb (102.5 kg), SpO2 98 %. Body mass index is 36.32 kg/m.   Physical Exam  Gen: AAOx3, NAD MSK: lumbar back, no TTP, no spasm. +++ TTP at insertion of gluteus medius on right leg. Neg SLR.  Right hip/thigh: mild TTP over GT, no TTP over ant groin, quads or IT band. FROM but pain and cramping with ext rotation and extension  Results for orders placed or performed in visit on 06/20/18 (from the past 24 hour(s))  POCT urinalysis dipstick     Status: Abnormal   Collection Time: 06/20/18 11:51 AM  Result Value Ref Range   Color, UA yellow yellow   Clarity, UA clear clear   Glucose, UA negative negative mg/dL   Bilirubin,  UA negative negative   Ketones, POC UA negative negative mg/dL   Spec Grav, UA 9.6041.020 5.4091.010 - 1.025   Blood, UA moderate (A) negative   pH, UA 6.5 5.0 - 8.0   Protein Ur, POC negative negative mg/dL   Urobilinogen, UA 0.2 0.2 or 1.0 E.U./dL   Nitrite, UA Negative Negative   Leukocytes, UA Negative Negative     ASSESSMENT and PLAN  1. Dysuria Discussed use of OTC vaginal moisturizers. microscopy and culture given + blood and intermittent sx that are new. - POCT urinalysis dipstick - Urine Culture - Urine Microscopic - Care order/instruction:  2. Vitamin D deficiency - Vitamin D, 25-hydroxy  3. Muscle cramps - Comprehensive metabolic panel - Magnesium  4. Essential  hypertension Controlled. Continue current regime.  - Comprehensive metabolic panel  5. H/O hypokalemia  6. Strain of gluteus medius of right lower extremity, initial encounter Discussed supportive measures, importance of stretching. Provided patient educational handout.   7. Elevated ferritin - Ferritin    Return if symptoms worsen or fail to improve.    Myles LippsIrma M Santiago, MD [facility] at [facility] [address] [place], [place] [address] Ph.  [phone] Fax [phone]

## 2018-06-20 NOTE — Patient Instructions (Addendum)
Do trial of vaginal moisterizers    IF you received an x-ray today, you will receive an invoice from [facility]. Please contact [facility] at [phone] with questions or concerns regarding your invoice.   IF you received labwork today, you will receive an invoice from [facility]. Please contact [facility] at [phone] with questions or concerns regarding your invoice.   Our billing staff will not be able to assist you with questions regarding bills from these companies.  You will be contacted with the lab results as soon as they are available. The fastest way to get your results is to activate your My Chart account. Instructions are located on the last page of this paperwork. If you have not heard from us regarding the results in 2 weeks, please contact this office.

## 2018-06-21 LAB — COMPREHENSIVE METABOLIC PANEL
ALT: 10 IU/L (ref 0–32)
AST: 13 IU/L (ref 0–40)
Albumin/Globulin Ratio: 1.6 (ref 1.2–2.2)
Albumin: 4.3 g/dL (ref 3.5–5.5)
Alkaline Phosphatase: 94 IU/L (ref 39–117)
BUN/Creatinine Ratio: 15 (ref 9–23)
BUN: 13 mg/dL (ref 6–24)
Bilirubin Total: 0.4 mg/dL (ref 0.0–1.2)
CO2: 28 mmol/L (ref 20–29)
Calcium: 9.5 mg/dL (ref 8.7–10.2)
Chloride: 99 mmol/L (ref 96–106)
Creatinine, Ser: 0.86 mg/dL (ref 0.57–1.00)
GFR calc Af Amer: 89 mL/min/{1.73_m2} (ref >59)
GFR calc non Af Amer: 77 mL/min/{1.73_m2} (ref >59)
Globulin, Total: 2.7 g/dL (ref 1.5–4.5)
Glucose: 67 mg/dL (ref 65–99)
Potassium: 4 mmol/L (ref 3.5–5.2)
Sodium: 141 mmol/L (ref 134–144)
Total Protein: 7 g/dL (ref 6.0–8.5)

## 2018-06-21 LAB — URINALYSIS, MICROSCOPIC ONLY
Bacteria, UA: NONE SEEN
Casts: NONE SEEN /lpf
RBC, UA: NONE SEEN /hpf (ref 0–2)

## 2018-06-21 LAB — VITAMIN D 25 HYDROXY (VIT D DEFICIENCY, FRACTURES): Vit D, 25-Hydroxy: 22 ng/mL — ABNORMAL LOW (ref 30.0–100.0)

## 2018-06-21 LAB — URINE CULTURE

## 2018-06-21 LAB — FERRITIN: Ferritin: 164 ng/mL — ABNORMAL HIGH (ref 15–150)

## 2018-06-21 LAB — MAGNESIUM: Magnesium: 1.7 mg/dL (ref 1.6–2.3)

## 2018-06-27 ENCOUNTER — Encounter: Payer: Self-pay | Admitting: *Deleted

## 2018-06-27 ENCOUNTER — Telehealth: Payer: Self-pay | Admitting: Family Medicine

## 2018-06-27 NOTE — Telephone Encounter (Signed)
This encounter was created in error - please disregard.

## 2018-06-27 NOTE — Telephone Encounter (Unsigned)
Copied from CRM [id number]. Topic: Quick Communication - Rx Refill/Question >> Jun 27, 2018  3:30 PM Gaynelle AduPoole, Shalonda wrote: Medication: Vitamin D, Ergocalciferol, (DRISDOL) 50000 units CAPS capsule   Has the patient contacted their pharmacy? no  Preferred Pharmacy (with phone number or street name): [facility] [address]- [place], [place] - [phone] (Phone) [phone] (Fax)    Agent: Please be advised that RX refills may take up to 3 business days. We ask that you follow-up with your pharmacy.

## 2018-06-27 NOTE — Telephone Encounter (Signed)
Please advise patient it can take up to 2 weeks to receive lab results.  She will be called once released.

## 2018-06-27 NOTE — Telephone Encounter (Signed)
Copied from CRM [id number]. Topic: General - Other >> Jun 27, 2018  3:29 PM Gaynelle AduPoole, Shalonda wrote: Reason for CRM: Patient is requesting a call back in regards to her lab result please advise.

## 2018-06-27 NOTE — Telephone Encounter (Signed)
Copied from CRM [id number]. Topic: Quick Communication - Lab Results >> Jun 27, 2018  5:18 PM Stephannie LiSimmons, Mylin Gignac L, NT wrote: Patient would like a return call in regards to her vitamin d levels and also her mammogram results and [facility] [address] [facility] [place], [place] - [address] [phone] (Phone) [phone] (Fax)

## 2018-06-28 NOTE — Telephone Encounter (Signed)
Pt calling to check status on receiving a call with her mammogram results.

## 2018-06-29 NOTE — Telephone Encounter (Signed)
Results released thru my chart

## 2018-07-01 NOTE — Telephone Encounter (Signed)
Pt calling to find out when her medication will be called in. °

## 2018-07-06 NOTE — Telephone Encounter (Signed)
Left detailed message  Unsure of what medication she is talking about Lisinopril and potasium should both have refills until 01/2019

## 2018-07-18 ENCOUNTER — Telehealth: Payer: Self-pay | Admitting: Family Medicine

## 2018-07-18 ENCOUNTER — Ambulatory Visit (INDEPENDENT_AMBULATORY_CARE_PROVIDER_SITE_OTHER): Payer: 59 | Admitting: Family Medicine

## 2018-07-18 ENCOUNTER — Other Ambulatory Visit: Payer: Self-pay

## 2018-07-18 DIAGNOSIS — Z111 Encounter for screening for respiratory tuberculosis: Secondary | ICD-10-CM

## 2018-07-18 MED ORDER — VITAMIN D (ERGOCALCIFEROL) 1.25 MG (50000 UNIT) PO CAPS: 50000.0000 [IU] | ORAL_CAPSULE | ORAL | 1 refills | Status: DC

## 2018-07-18 NOTE — Patient Instructions (Signed)
PPD placement

## 2018-07-18 NOTE — Telephone Encounter (Signed)
Copied from CRM [id number]. Topic: Quick Communication - Rx Refill/Question >> Jul 18, 2018  2:08 PM Burchel, Abbi R wrote: Medication: Vitamin D, Ergocalciferol, (DRISDOL) 50000 units CAPS capsule   Preferred Pharmacy: [facility] [address], [place] - [address] [place] [address] Phone: [phone] Fax: [phone]

## 2018-07-19 NOTE — Telephone Encounter (Signed)
Attempted to contact pt regarding prescription refill; prescription written by Dr Koren Shiver dated 06/20/18 #13 take 1 capsule by mouth q 7 days; pt seen in office 06/20/18; left message on voicemail; if pt calls back please verify if she is taking medication once weekly.   Vitamin D, ergocalciferol refill Last Refill: 06/20/18 #13 Last OV: 06/20/18 PCP: Dr Koren Shiver Pharmacy: [facility] [place], [place]

## 2018-07-20 ENCOUNTER — Ambulatory Visit (INDEPENDENT_AMBULATORY_CARE_PROVIDER_SITE_OTHER): Payer: 59 | Admitting: Family Medicine

## 2018-07-20 DIAGNOSIS — Z111 Encounter for screening for respiratory tuberculosis: Secondary | ICD-10-CM

## 2018-07-20 LAB — TB SKIN TEST
Induration: 0 mm
TB Skin Test: NEGATIVE

## 2018-09-01 NOTE — Telephone Encounter (Signed)
DONE

## 2018-10-26 ENCOUNTER — Other Ambulatory Visit: Payer: Self-pay

## 2018-10-26 ENCOUNTER — Encounter: Payer: Self-pay | Admitting: Family Medicine

## 2018-10-26 ENCOUNTER — Ambulatory Visit (INDEPENDENT_AMBULATORY_CARE_PROVIDER_SITE_OTHER): Payer: 59 | Admitting: Family Medicine

## 2018-10-26 VITALS — BP 136/88 | HR 64 | Temp 98.4°F | Resp 16 | Ht 66.0 in | Wt 229.4 lb

## 2018-10-26 DIAGNOSIS — E559 Vitamin D deficiency, unspecified: Secondary | ICD-10-CM | POA: Diagnosis not present

## 2018-10-26 DIAGNOSIS — I10 Essential (primary) hypertension: Secondary | ICD-10-CM

## 2018-10-26 LAB — CBC
Hematocrit: 41.4 % (ref 34.0–46.6)
Hemoglobin: 13.6 g/dL (ref 11.1–15.9)
MCH: 29.1 pg (ref 26.6–33.0)
MCHC: 32.9 g/dL (ref 31.5–35.7)
MCV: 89 fL (ref 79–97)
Platelets: 170 10*3/uL (ref 150–450)
RBC: 4.68 x10E6/uL (ref 3.77–5.28)
RDW: 12.7 % (ref 12.3–15.4)
WBC: 6.6 10*3/uL (ref 3.4–10.8)

## 2018-10-26 MED ORDER — POTASSIUM CHLORIDE CRYS ER 20 MEQ PO TBCR: 20.0000 meq | EXTENDED_RELEASE_TABLET | Freq: Every day | ORAL | 3 refills | Status: DC

## 2018-10-26 MED ORDER — VITAMIN D (ERGOCALCIFEROL) 1.25 MG (50000 UNIT) PO CAPS: 50000.0000 [IU] | ORAL_CAPSULE | ORAL | 0 refills | Status: DC

## 2018-10-26 MED ORDER — LISINOPRIL-HYDROCHLOROTHIAZIDE 20-25 MG PO TABS: 1.0000 | ORAL_TABLET | Freq: Every day | ORAL | 3 refills | Status: DC

## 2018-10-26 NOTE — Progress Notes (Signed)
12/18/201911:28 AM  Dominique Burton 12/28/1965, 52 y.o. female [id number]  Chief Complaint  Patient presents with  . Medication Refill    need refill on vit d, lisinopril and potassium    HPI:   Patient is a 52 y.o. 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 0 No No No No     [facility] 2[facility] 10/26/2018 04/23/2018 04/01/2018  Decreased Interest 0 0 0  Down, Depressed, Hopeless 0 0 0  PHQ - 2 Score 0 0 0    No Known Allergies  Prior to Admission medications   Medication Sig Start Date End Date Taking? Authorizing Provider  lisinopril-hydrochlorothiazide (PRINZIDE,ZESTORETIC) 20-25 MG tablet Take 1 tablet by mouth daily. 01/11/18  Yes Myles LippsSantiago, Irma M, MD  potassium chloride SA (K-DUR,KLOR-CON) 20 MEQ tablet Take 1 tablet (20 mEq total) by mouth daily. 01/11/18  Yes Myles LippsSantiago, Irma M, MD  Vitamin D, Ergocalciferol, (DRISDOL) 50000 units CAPS capsule Take 1 capsule (50,000 Units total) by mouth every 7 (seven) days. 07/18/18  Yes Myles LippsSantiago, Irma M, MD    Past Medical History:  Diagnosis Date  . Hypertension   . Vitamin D deficiency     Past Surgical History:  Procedure Laterality Date  . ABDOMINAL HYSTERECTOMY     PARTIAL  . CESAREAN SECTION      Social History   Tobacco Use  . Smoking status: Never Smoker  . Smokeless tobacco: Never Used  Substance Use Topics  . Alcohol use: No    Family History  Problem Relation Age of Onset  .  Hypertension Mother   . Diabetes Father   . Hypertension Father   . Hypertension Sister   . Heart disease Brother   . Hypertension Brother   . Hypertension Maternal Grandmother   . Hypertension Maternal Grandfather     ROS Per hpi  OBJECTIVE:  Blood pressure 136/88, pulse 64, temperature 98.4 F (36.9 C), temperature source Oral, resp. rate 16, height 5\' 6"  (1.676 m), weight 229 lb 6.4 oz (104.1 kg), SpO2 97 %. Body mass index is 37.03 kg/m.   Physical Exam Vitals signs and nursing note reviewed.  Constitutional:      Appearance: She is well-developed.  HENT:     Head: Normocephalic and atraumatic.     Mouth/Throat:     Pharynx: No oropharyngeal exudate.  Eyes:     General: No scleral icterus.    Conjunctiva/sclera: Conjunctivae normal.     Pupils: Pupils are equal, round, and reactive to light.  Neck:     Musculoskeletal: Neck supple.  Cardiovascular:     Rate and Rhythm: Normal rate and regular rhythm.     Heart sounds: Normal heart sounds. No murmur. No friction rub. No gallop.   Pulmonary:     Effort: Pulmonary effort is normal.     Breath sounds: Normal breath sounds. No wheezing or rales.  Skin:    General: Skin is warm and dry.  Neurological:     Mental Status: She is alert and oriented to person, place, and time.     ASSESSMENT and PLAN  1. Essential hypertension Controlled. Continue current regime.  - Care order/instruction: - potassium chloride SA (K-DUR,KLOR-CON) 20 MEQ tablet; Take 1 tablet (20 mEq total) by mouth daily. - Basic Metabolic Panel - CBC  2. Vitamin D deficiency Checking labs today, medications will be adjusted as needed.  - Vitamin D, 25-hydroxy  Other orders - lisinopril-hydrochlorothiazide (PRINZIDE,ZESTORETIC) 20-25 MG tablet; Take 1 tablet by mouth daily. - Vitamin D, Ergocalciferol, (DRISDOL) 1.25 MG (50000 UT) CAPS capsule; Take 1 capsule (50,000 Units total) by mouth every 7 (seven) days.   Return in about 6 months  (around 04/27/2019) for htn.    Myles Lipps, MD Primary Care at [facility] [address] [place], [place] [address] Ph.  [phone] Fax [phone]

## 2018-10-26 NOTE — Patient Instructions (Addendum)
   If vitamin D 30 or greater, please start over the counter vitamin D supplement of 2000 units a day   If you have lab work done today you will be contacted with your lab results within the next 2 weeks.  If you have not heard from us then please contact us. The fastest way to get your results is to register for My Chart.   IF you received an x-ray today, you will receive an invoice from [facility]. Please contact [facility] at [phone] with questions or concerns regarding your invoice.   IF you received labwork today, you will receive an invoice from [facility]. Please contact [facility] at [phone] with questions or concerns regarding your invoice.   Our billing staff will not be able to assist you with questions regarding bills from these companies.  You will be contacted with the lab results as soon as they are available. The fastest way to get your results is to activate your My Chart account. Instructions are located on the last page of this paperwork. If you have not heard from us regarding the results in 2 weeks, please contact this office.

## 2018-10-27 LAB — BASIC METABOLIC PANEL
BUN/Creatinine Ratio: 16 (ref 9–23)
BUN: 13 mg/dL (ref 6–24)
CO2: 27 mmol/L (ref 20–29)
Calcium: 9.6 mg/dL (ref 8.7–10.2)
Chloride: 100 mmol/L (ref 96–106)
Creatinine, Ser: 0.8 mg/dL (ref 0.57–1.00)
GFR calc Af Amer: 97 mL/min/{1.73_m2} (ref >59)
GFR calc non Af Amer: 84 mL/min/{1.73_m2} (ref >59)
Glucose: 117 mg/dL — ABNORMAL HIGH (ref 65–99)
Potassium: 3.6 mmol/L (ref 3.5–5.2)
Sodium: 140 mmol/L (ref 134–144)

## 2018-10-27 LAB — VITAMIN D 25 HYDROXY (VIT D DEFICIENCY, FRACTURES): Vit D, 25-Hydroxy: 26 ng/mL — ABNORMAL LOW (ref 30.0–100.0)

## 2018-10-31 ENCOUNTER — Encounter: Payer: Self-pay | Admitting: *Deleted

## 2018-10-31 ENCOUNTER — Ambulatory Visit: Payer: 59

## 2018-11-01 ENCOUNTER — Ambulatory Visit: Payer: 59

## 2018-11-01 ENCOUNTER — Ambulatory Visit (INDEPENDENT_AMBULATORY_CARE_PROVIDER_SITE_OTHER): Payer: 59 | Admitting: Family Medicine

## 2018-11-01 DIAGNOSIS — Z23 Encounter for immunization: Secondary | ICD-10-CM

## 2019-01-28 ENCOUNTER — Ambulatory Visit: Payer: 59 | Admitting: Family Medicine

## 2019-01-28 ENCOUNTER — Encounter: Payer: Self-pay | Admitting: Family Medicine

## 2019-01-28 ENCOUNTER — Other Ambulatory Visit: Payer: Self-pay

## 2019-01-28 ENCOUNTER — Ambulatory Visit (INDEPENDENT_AMBULATORY_CARE_PROVIDER_SITE_OTHER): Payer: BLUE CROSS/BLUE SHIELD | Admitting: Family Medicine

## 2019-01-28 VITALS — BP 134/86 | HR 73 | Temp 97.6°F | Resp 18 | Ht 66.0 in | Wt 237.0 lb

## 2019-01-28 DIAGNOSIS — J302 Other seasonal allergic rhinitis: Secondary | ICD-10-CM

## 2019-01-28 DIAGNOSIS — R8290 Unspecified abnormal findings in urine: Secondary | ICD-10-CM

## 2019-01-28 DIAGNOSIS — R3121 Asymptomatic microscopic hematuria: Secondary | ICD-10-CM | POA: Diagnosis not present

## 2019-01-28 LAB — POCT URINALYSIS DIP (MANUAL ENTRY)
Bilirubin, UA: NEGATIVE
Glucose, UA: NEGATIVE mg/dL
Ketones, POC UA: NEGATIVE mg/dL
Leukocytes, UA: NEGATIVE
Nitrite, UA: NEGATIVE
Protein Ur, POC: NEGATIVE mg/dL
Spec Grav, UA: 1.02 (ref 1.010–1.025)
Urobilinogen, UA: 0.2 E.U./dL
pH, UA: 5.5 (ref 5.0–8.0)

## 2019-01-28 LAB — POC MICROSCOPIC URINALYSIS (UMFC): Mucus: ABSENT

## 2019-01-28 MED ORDER — CETIRIZINE HCL 10 MG PO TABS: 10.0000 mg | ORAL_TABLET | Freq: Every day | ORAL | 11 refills | Status: DC

## 2019-01-28 MED ORDER — NITROFURANTOIN MONOHYD MACRO 100 MG PO CAPS: 100.0000 mg | ORAL_CAPSULE | Freq: Two times a day (BID) | ORAL | 0 refills | Status: DC

## 2019-01-28 MED ORDER — FLUTICASONE PROPIONATE 50 MCG/ACT NA SUSP: 1.0000 | Freq: Two times a day (BID) | NASAL | 6 refills | Status: DC

## 2019-01-28 NOTE — Patient Instructions (Addendum)
    Replens vaginal moisturizer Silicone based sexual lubricant    If you have lab work done today you will be contacted with your lab results within the next 2 weeks.  If you have not heard from [place] then please contact us. The fastest way to get your results is to register for My Chart.   IF you received an x-ray today, you will receive an invoice from [facility]. Please contact [facility] at [phone] with questions or concerns regarding your invoice.   IF you received labwork today, you will receive an invoice from [facility]. Please contact [facility] at [phone] with questions or concerns regarding your invoice.   Our billing staff will not be able to assist you with questions regarding bills from these companies.  You will be contacted with the lab results as soon as they are available. The fastest way to get your results is to activate your My Chart account. Instructions are located on the last page of this paperwork. If you have not heard from [place] regarding the results in 2 weeks, please contact this office.

## 2019-01-28 NOTE — Progress Notes (Signed)
3/21/20209:57 AM  Dominique Burton Dec 30, 1965, 53 y.o., female [id number]  Chief Complaint  Patient presents with  . sneezing    x1week   . Sore Throat  . urine problems    has a smell no discharge     HPI:   Patient is a 53 y.o. female with past medical history significant for HTN and vitamin D who presents today for sneezing and foul smelling urine  A week ago started having nasal congestion, runny nose, sore throat, lots of sneezing, sinus pressure No headaches, no fever, no SOB, no itchy eyes  Having a mild odor of her urine, reports dark urine No vaginal discharge, no dysuria, no hematuria Having sign vaginal dryness, painful intercourse Not using anything   Fall Risk  01/28/2019 10/26/2018 04/23/2018 04/01/2018 04/01/2018  Falls in the past year? 0 0 No No No  Follow up Falls evaluation completed - - - -     Depression screen [facility] 2/9 01/28/2019 10/26/2018 04/23/2018  Decreased Interest 0 0 0  Down, Depressed, Hopeless 0 0 0  PHQ - 2 Score 0 0 0    No Known Allergies  Prior to Admission medications   Medication Sig Start Date End Date Taking? Authorizing Provider  lisinopril-hydrochlorothiazide (PRINZIDE,ZESTORETIC) 20-25 MG tablet Take 1 tablet by mouth daily. 10/26/18  Yes Myles Lipps, MD  potassium chloride SA (K-DUR,KLOR-CON) 20 MEQ tablet Take 1 tablet (20 mEq total) by mouth daily. 10/26/18  Yes Myles Lipps, MD  Vitamin D, Ergocalciferol, (DRISDOL) 1.25 MG (50000 UT) CAPS capsule Take 1 capsule (50,000 Units total) by mouth every 7 (seven) days. 10/26/18  Yes Myles Lipps, MD    Past Medical History:  Diagnosis Date  . Hypertension   . Vitamin D deficiency     Past Surgical History:  Procedure Laterality Date  . ABDOMINAL HYSTERECTOMY     PARTIAL  . CESAREAN SECTION      Social History   Tobacco Use  . Smoking status: Never Smoker  . Smokeless tobacco: Never Used  Substance Use Topics  . Alcohol use: No    Family History  Problem  Relation Age of Onset  . Hypertension Mother   . Diabetes Father   . Hypertension Father   . Hypertension Sister   . Heart disease Brother   . Hypertension Brother   . Hypertension Maternal Grandmother   . Hypertension Maternal Grandfather     ROS   OBJECTIVE:  Today's Vitals   01/28/19 0951  BP: 134/86  Pulse: 73  Resp: 18  Temp: 97.6 F (36.4 C)  TempSrc: Oral  SpO2: 98%  Weight: 237 lb (107.5 kg)  Height: 5\' 6"  (1.676 m)   Body mass index is 38.25 kg/m.   Physical Exam Vitals signs and nursing note reviewed.  Constitutional:      Appearance: She is well-developed.  HENT:     Head: Normocephalic and atraumatic.     Right Ear: Hearing, tympanic membrane, ear canal and external ear normal.     Left Ear: Hearing, tympanic membrane, ear canal and external ear normal.     Nose: Rhinorrhea (clear, nares pale and boggy) present.     Mouth/Throat:     Pharynx: No pharyngeal swelling, oropharyngeal exudate or posterior oropharyngeal erythema.     Tonsils: No tonsillar exudate. 0 on the right. 0 on the left.  Eyes:     Conjunctiva/sclera: Conjunctivae normal.     Pupils: Pupils are equal, round, and reactive to light.  Neck:     Musculoskeletal: Neck supple.  Cardiovascular:     Rate and Rhythm: Normal rate and regular rhythm.     Heart sounds: Normal heart sounds. No murmur. No friction rub. No gallop.   Pulmonary:     Effort: Pulmonary effort is normal.     Breath sounds: Normal breath sounds. No wheezing or rales.  Lymphadenopathy:     Cervical: No cervical adenopathy.  Skin:    General: Skin is warm and dry.  Neurological:     Mental Status: She is alert and oriented to person, place, and time.     Results for orders placed or performed in visit on 01/28/19 (from the past 24 hour(s))  POCT urinalysis dipstick     Status: Abnormal   Collection Time: 01/28/19 10:21 AM  Result Value Ref Range   Color, UA yellow yellow   Clarity, UA clear clear   Glucose,  UA negative negative mg/dL   Bilirubin, UA negative negative   Ketones, POC UA negative negative mg/dL   Spec Grav, UA 4.163 8.453 - 1.025   Blood, UA moderate (A) negative   pH, UA 5.5 5.0 - 8.0   Protein Ur, POC negative negative mg/dL   Urobilinogen, UA 0.2 0.2 or 1.0 E.U./dL   Nitrite, UA Negative Negative   Leukocytes, UA Negative Negative  POCT Microscopic Urinalysis ([facility])     Status: Abnormal   Collection Time: 01/28/19 10:34 AM  Result Value Ref Range   WBC,UR,HPF,POC Few (A) None WBC/hpf   RBC,UR,HPF,POC Moderate (A) None RBC/hpf   Bacteria Many (A) None, Too numerous to count   Mucus Absent Absent   Epithelial Cells, UR Per Microscopy Many (A) None, Too numerous to count cells/hpf      ASSESSMENT and PLAN  1. Seasonal allergies Discussed supportive measures, new meds r/se/b and RTC precautions.   2. Foul smelling urine 3. Asymptomatic microscopic hematuria 4. Abnormal urinalysis - POCT urinalysis dipstick - POCT Microscopic Urinalysis ([facility]) - Urine Culture  UA negative but microscopy +WBC, bacteria, treating as UTI. rx for nitrofurantoin. Ur cx sent.   Other orders - cetirizine (ZYRTEC) 10 MG tablet; Take 1 tablet (10 mg total) by mouth daily. - fluticasone (FLONASE) 50 MCG/ACT nasal spray; Place 1 spray into both nostrils 2 (two) times daily. - nitrofurantoin, macrocrystal-monohydrate, (MACROBID) 100 MG capsule; Take 1 capsule (100 mg total) by mouth 2 (two) times daily.  Return if symptoms worsen or fail to improve.    Myles Lipps, MD Primary Care at [facility] [address] [place], [place] [address] Ph.  [phone] Fax [phone]

## 2019-01-30 LAB — URINE CULTURE

## 2019-06-18 ENCOUNTER — Other Ambulatory Visit: Payer: Self-pay | Admitting: Family Medicine

## 2019-06-18 NOTE — Telephone Encounter (Signed)
Requested medication (s) are due for refill today: yes  Requested medication (s) are on the active medication list: yes  Last refill:  10/26/2018  Future visit scheduled: no  Notes to clinic:  50,000 IU strength is not delegated to NT to refill.   Requested Prescriptions  Pending Prescriptions Disp Refills   Vitamin D, Ergocalciferol, (DRISDOL) 1.25 MG (50000 UT) CAPS capsule [Pharmacy Med Name: Vitamin D (Ergocalciferol) 50000 UNIT Oral Capsule] 4 capsule 0    Sig: TAKE ONE CAPSULE BY MOUTH EVERY 7 DAYS     Endocrinology:  Vitamins - Vitamin D Supplementation Failed - 06/18/2019  3:34 PM      Failed - 50,000 IU strengths are not delegated      Failed - Phosphate in normal range and within 360 days    No results found for: PHOS       Failed - Vitamin D in normal range and within 360 days    Vit D, 25-Hydroxy  Date Value Ref Range Status  10/26/2018 26.0 (L) 30.0 - 100.0 ng/mL Final    Comment:    Vitamin D deficiency has been defined by the Institute of Medicine and an Endocrine Society practice guideline as a level of serum 25-OH vitamin D less than 20 ng/mL (1,2). The Endocrine Society went on to further define vitamin D insufficiency as a level between 21 and 29 ng/mL (2). 1. IOM (Institute of Medicine). 2010. Dietary reference    intakes for calcium and D. [place]: The    [employer]. 2. Holick MF, Binkley [place], Bischoff-Ferrari HA, et al.    Evaluation, treatment, and prevention of vitamin D    deficiency: an Endocrine Society clinical practice    guideline. JCEM. 2011 Jul; 96(7):1911-30.          Passed - Ca in normal range and within 360 days    Calcium  Date Value Ref Range Status  10/26/2018 9.6 8.7 - 10.2 mg/dL Final         Passed - Valid encounter within last 12 months    Recent Outpatient Visits          4 months ago Seasonal allergies   [facility], Lilia Argue, MD   7 months ago Essential hypertension   [facility]  at [facility], Lilia Argue, MD   11 months ago Screening for tuberculosis   [facility], Lilia Argue, MD   11 months ago Screening for tuberculosis   [facility], Lilia Argue, MD   12 months ago Dysuria   [facility], Lilia Argue, MD

## 2019-07-07 ENCOUNTER — Telehealth (INDEPENDENT_AMBULATORY_CARE_PROVIDER_SITE_OTHER): Payer: 59 | Admitting: Family Medicine

## 2019-07-07 ENCOUNTER — Other Ambulatory Visit: Payer: Self-pay

## 2019-07-07 ENCOUNTER — Encounter: Payer: Self-pay | Admitting: Family Medicine

## 2019-07-07 DIAGNOSIS — R1013 Epigastric pain: Secondary | ICD-10-CM | POA: Diagnosis not present

## 2019-07-07 DIAGNOSIS — I10 Essential (primary) hypertension: Secondary | ICD-10-CM

## 2019-07-07 DIAGNOSIS — E559 Vitamin D deficiency, unspecified: Secondary | ICD-10-CM

## 2019-07-07 MED ORDER — LISINOPRIL-HYDROCHLOROTHIAZIDE 20-25 MG PO TABS: 1.0000 | ORAL_TABLET | Freq: Every day | ORAL | 3 refills | Status: DC

## 2019-07-07 MED ORDER — POTASSIUM CHLORIDE CRYS ER 20 MEQ PO TBCR: 20.0000 meq | EXTENDED_RELEASE_TABLET | Freq: Every day | ORAL | 3 refills | Status: DC

## 2019-07-07 MED ORDER — FAMOTIDINE 20 MG PO TABS: 20.0000 mg | ORAL_TABLET | Freq: Two times a day (BID) | ORAL | 0 refills | Status: DC

## 2019-07-07 NOTE — Progress Notes (Signed)
Dominique Burton is having pain in the stomach after she eats. She thinks it is due to the type of food she is eating being high is fat and carb. Says after tweaking her diet, she is starting not to feel the pain as much. She is also needing refills on medication.

## 2019-07-07 NOTE — Progress Notes (Signed)
Virtual Visit Note  I connected with patient on 07/07/19 at 1118am by phone due to unstable internet connection and verified that I am speaking with the correct person using two identifiers. Dominique Burton is currently located at home and patient is currently with them during visit. The provider, Rutherford Guys, MD is located in their office at time of visit.  I discussed the limitations, risks, security and privacy concerns of performing an evaluation and management service by telephone and the availability of in person appointments. I also discussed with the patient that there may be a patient responsible charge related to this service. The patient expressed understanding and agreed to proceed.   CC: abd pain and med refills  HPI   Started having epigastric abd pain about 2 weeks ago Intermittent, does not radiate, still has GB getting better after dietary changes, stopped eating fried, spicy foods, citrus fruits She has been taking tums No fever or chills, no nausea, no vomiting, no diarrhea, no melena  Has not been taking otc vit d supplement ? BP Readings from Last 3 Encounters:  01/28/19 134/86  10/26/18 136/88  06/20/18 126/80    No Known Allergies  Prior to Admission medications   Medication Sig Start Date End Date Taking? Authorizing Provider  cetirizine (ZYRTEC) 10 MG tablet Take 1 tablet (10 mg total) by mouth daily. 01/28/19   Rutherford Guys, MD  fluticasone (FLONASE) 50 MCG/ACT nasal spray Place 1 spray into both nostrils 2 (two) times daily. 01/28/19   Rutherford Guys, MD  lisinopril-hydrochlorothiazide (PRINZIDE,ZESTORETIC) 20-25 MG tablet Take 1 tablet by mouth daily. 10/26/18   Rutherford Guys, MD  nitrofurantoin, macrocrystal-monohydrate, (MACROBID) 100 MG capsule Take 1 capsule (100 mg total) by mouth 2 (two) times daily. 01/28/19   Rutherford Guys, MD  potassium chloride SA (K-DUR,KLOR-CON) 20 MEQ tablet Take 1 tablet (20 mEq total) by mouth daily. 10/26/18    Rutherford Guys, MD  Vitamin D, Ergocalciferol, (DRISDOL) 1.25 MG (50000 UT) CAPS capsule Take 1 capsule (50,000 Units total) by mouth every 7 (seven) days. 10/26/18   Rutherford Guys, MD    Past Medical History:  Diagnosis Date  . Hypertension   . Vitamin D deficiency     Past Surgical History:  Procedure Laterality Date  . ABDOMINAL HYSTERECTOMY     PARTIAL  . CESAREAN SECTION      Social History   Tobacco Use  . Smoking status: Never Smoker  . Smokeless tobacco: Never Used  Substance Use Topics  . Alcohol use: No    Family History  Problem Relation Age of Onset  . Hypertension Mother   . Diabetes Father   . Hypertension Father   . Hypertension Sister   . Heart disease Brother   . Hypertension Brother   . Hypertension Maternal Grandmother   . Hypertension Maternal Grandfather     ROS Per hpi  Objective  Vitals as reported by the patient: none   ASSESSMENT and PLAN  1. Abdominal pain, epigastric Improved with dietary changes, no red flag signs. Adding pepcid. RTC precautions reviewed  2. Essential hypertension - potassium chloride SA (K-DUR) 20 MEQ tablet; Take 1 tablet (20 mEq total) by mouth daily. - Comprehensive metabolic panel; Future - Lipid panel; Future  3. Vitamin D deficiency - VITAMIN D 25 Hydroxy (Vit-D Deficiency, Fractures); Future  Other orders - lisinopril-hydrochlorothiazide (ZESTORETIC) 20-25 MG tablet; Take 1 tablet by mouth daily. - famotidine (PEPCID) 20 MG tablet; Take 1  tablet (20 mg total) by mouth 2 (two) times daily.  FOLLOW-UP: fasting labs in 1 week, followup in 3 months for HTN   The above assessment and management plan was discussed with the patient. The patient verbalized understanding of and has agreed to the management plan. Patient is aware to call the clinic if symptoms persist or worsen. Patient is aware when to return to the clinic for a follow-up visit. Patient educated on when it is appropriate to go to the  emergency department.    I provided 12 minutes of non-face-to-face time during this encounter.  Myles LippsIrma M Santiago, MD Primary Care at [facility] [address] [place], [place] [address] Ph.  [phone] Fax [phone]

## 2019-07-11 ENCOUNTER — Telehealth: Payer: Self-pay | Admitting: Family Medicine

## 2019-07-11 NOTE — Telephone Encounter (Signed)
Called pt and [facility] to schedule nurse visit for fasting labs, orders in, and 3 month f/u for htn

## 2019-07-12 ENCOUNTER — Ambulatory Visit: Payer: 59 | Admitting: Registered Nurse

## 2019-07-12 ENCOUNTER — Other Ambulatory Visit: Payer: Self-pay

## 2019-07-12 DIAGNOSIS — E559 Vitamin D deficiency, unspecified: Secondary | ICD-10-CM

## 2019-07-12 DIAGNOSIS — I10 Essential (primary) hypertension: Secondary | ICD-10-CM

## 2019-07-13 LAB — CBC WITH DIFFERENTIAL/PLATELET
Basophils Absolute: 0 10*3/uL (ref 0.0–0.2)
Basos: 0 %
EOS (ABSOLUTE): 0.1 10*3/uL (ref 0.0–0.4)
Eos: 2 %
Hematocrit: 40.5 % (ref 34.0–46.6)
Hemoglobin: 13.2 g/dL (ref 11.1–15.9)
Immature Grans (Abs): 0 10*3/uL (ref 0.0–0.1)
Immature Granulocytes: 0 %
Lymphocytes Absolute: 2 10*3/uL (ref 0.7–3.1)
Lymphs: 28 %
MCH: 28.4 pg (ref 26.6–33.0)
MCHC: 32.6 g/dL (ref 31.5–35.7)
MCV: 87 fL (ref 79–97)
Monocytes Absolute: 0.5 10*3/uL (ref 0.1–0.9)
Monocytes: 7 %
Neutrophils Absolute: 4.5 10*3/uL (ref 1.4–7.0)
Neutrophils: 63 %
Platelets: 128 10*3/uL — ABNORMAL LOW (ref 150–450)
RBC: 4.65 x10E6/uL (ref 3.77–5.28)
RDW: 13.5 % (ref 11.7–15.4)
WBC: 7.1 10*3/uL (ref 3.4–10.8)

## 2019-07-13 LAB — COMPREHENSIVE METABOLIC PANEL
ALT: 8 IU/L (ref 0–32)
AST: 13 IU/L (ref 0–40)
Albumin/Globulin Ratio: 1.8 (ref 1.2–2.2)
Albumin: 4.2 g/dL (ref 3.8–4.9)
Alkaline Phosphatase: 88 IU/L (ref 39–117)
BUN/Creatinine Ratio: 16 (ref 9–23)
BUN: 12 mg/dL (ref 6–24)
Bilirubin Total: 0.5 mg/dL (ref 0.0–1.2)
CO2: 25 mmol/L (ref 20–29)
Calcium: 9.4 mg/dL (ref 8.7–10.2)
Chloride: 102 mmol/L (ref 96–106)
Creatinine, Ser: 0.75 mg/dL (ref 0.57–1.00)
GFR calc Af Amer: 105 mL/min/{1.73_m2} (ref >59)
GFR calc non Af Amer: 91 mL/min/{1.73_m2} (ref >59)
Globulin, Total: 2.3 g/dL (ref 1.5–4.5)
Glucose: 98 mg/dL (ref 65–99)
Potassium: 3.7 mmol/L (ref 3.5–5.2)
Sodium: 141 mmol/L (ref 134–144)
Total Protein: 6.5 g/dL (ref 6.0–8.5)

## 2019-07-13 LAB — LIPID PANEL
Chol/HDL Ratio: 2.8 ratio (ref 0.0–4.4)
Cholesterol, Total: 150 mg/dL (ref 100–199)
HDL: 53 mg/dL (ref >39)
LDL Chol Calc (NIH): 75 mg/dL (ref 0–99)
Triglycerides: 122 mg/dL (ref 0–149)
VLDL Cholesterol Cal: 22 mg/dL (ref 5–40)

## 2019-07-13 LAB — VITAMIN D 25 HYDROXY (VIT D DEFICIENCY, FRACTURES): Vit D, 25-Hydroxy: 28.3 ng/mL — ABNORMAL LOW (ref 30.0–100.0)

## 2019-08-01 ENCOUNTER — Encounter: Payer: Self-pay | Admitting: Family Medicine

## 2019-08-01 ENCOUNTER — Ambulatory Visit (INDEPENDENT_AMBULATORY_CARE_PROVIDER_SITE_OTHER): Payer: 59 | Admitting: Family Medicine

## 2019-08-01 ENCOUNTER — Other Ambulatory Visit: Payer: Self-pay

## 2019-08-01 VITALS — BP 138/82 | HR 74 | Temp 98.4°F | Resp 14 | Wt 242.3 lb

## 2019-08-01 DIAGNOSIS — R3915 Urgency of urination: Secondary | ICD-10-CM | POA: Diagnosis not present

## 2019-08-01 DIAGNOSIS — Z23 Encounter for immunization: Secondary | ICD-10-CM | POA: Diagnosis not present

## 2019-08-01 LAB — POCT URINALYSIS DIP (MANUAL ENTRY)
Bilirubin, UA: NEGATIVE
Glucose, UA: NEGATIVE mg/dL
Nitrite, UA: NEGATIVE
Protein Ur, POC: NEGATIVE mg/dL
Spec Grav, UA: 1.025 (ref 1.010–1.025)
Urobilinogen, UA: 0.2 E.U./dL
pH, UA: 5.5 (ref 5.0–8.0)

## 2019-08-01 LAB — POC MICROSCOPIC URINALYSIS (UMFC): Mucus: ABSENT

## 2019-08-01 MED ORDER — PHENAZOPYRIDINE HCL 200 MG PO TABS: 200.0000 mg | ORAL_TABLET | Freq: Three times a day (TID) | ORAL | 0 refills | Status: DC | PRN

## 2019-08-01 NOTE — Patient Instructions (Addendum)
If you have lab work done today you will be contacted with your lab results within the next 2 weeks.  If you have not heard from [place] then please contact us. The fastest way to get your results is to register for My Chart.   IF you received an x-ray today, you will receive an invoice from [facility]. Please contact [facility] at [phone] with questions or concerns regarding your invoice.   IF you received labwork today, you will receive an invoice from [facility]. Please contact [facility] at [phone] with questions or concerns regarding your invoice.   Our billing staff will not be able to assist you with questions regarding bills from these companies.  You will be contacted with the lab results as soon as they are available. The fastest way to get your results is to activate your My Chart account. Instructions are located on the last page of this paperwork. If you have not heard from [place] regarding the results in 2 weeks, please contact this office.      Interstitial Cystitis  Interstitial cystitis is inflammation of the bladder. This may cause pain in the bladder area as well as a frequent and urgent need to urinate. The bladder is a hollow organ in the lower part of the abdomen. It stores urine after the urine is made in the kidneys. The severity of interstitial cystitis can vary from person to person. You may have flare-ups, and then your symptoms may go away for a while. For many people, it becomes a long-term (chronic) problem. What are the causes? The cause of this condition is not known. What increases the risk? The following factors may make you more likely to develop this condition:  You are female.  You have fibromyalgia.  You have irritable bowel syndrome (IBS).  You have endometriosis. This condition may be aggravated by:  Stress.  Smoking.  Spicy foods. What are the signs or symptoms? Symptoms of interstitial cystitis vary, and they can  change over time. Symptoms may include:  Discomfort or pain in the bladder area, which is in the lower abdomen. Pain can range from mild to severe. The pain may change in intensity as the bladder fills with urine or as it empties.  Pain in the pelvic area, between the hip bones.  An urgent need to urinate.  Frequent urination.  Pain during urination.  Pain during sex.  Blood in the urine. For women, symptoms often get worse during menstruation. How is this diagnosed? This condition is diagnosed based on your symptoms, your medical history, and a physical exam. You may have tests to rule out other conditions, such as:  Urine tests.  Cystoscopy. For this test, a tool similar to a very thin telescope is used to look into your bladder.  Biopsy. This involves taking a sample of tissue from the bladder to be examined under a microscope. How is this treated? There is no cure for this condition, but treatment can help you control your symptoms. Work closely with your health care provider to find the most effective treatments for you. Treatment options may include:  Medicines to relieve pain and reduce how often you feel the need to urinate.  Learning ways to control when you urinate (bladder training).  Lifestyle changes, such as changing your diet or taking steps to control stress.  Using a device that provides electrical stimulation to your nerves, which can relieve pain (neuromodulation therapy). The device is placed on your back, where  it blocks the nerves that cause you to feel pain in your bladder area.  A procedure that stretches your bladder by filling it with air or fluid.  Surgery. This is rare. It is only done for extreme cases, if other treatments do not help. Follow these instructions at home: Bladder training   Use bladder training techniques as directed. Techniques may include: ? Urinating at scheduled times. ? Training yourself to delay urination. ? Doing  exercises (Kegel exercises) to strengthen the muscles that control urine flow.  Keep a bladder diary. ? Write down the times that you urinate and any symptoms that you have. This can help you find out which foods, liquids, or activities make your symptoms worse. ? Use your bladder diary to schedule bathroom trips. If you are away from home, plan to be near a bathroom at each of your scheduled times.  Make sure that you urinate just before you leave the house and just before you go to bed. Eating and drinking  Make dietary changes as recommended by your health care provider. You may need to avoid: ? Spicy foods. ? Foods that contain a lot of potassium.  Limit your intake of beverages that make you need to urinate. These include: ? Caffeinated beverages like soda, coffee, and tea. ? Alcohol. General instructions  Take over-the-counter and prescription medicines only as told by your health care provider.  Do not drink alcohol.  You can try a warm or cool compress over your bladder for comfort.  Avoid wearing tight clothing.  Do not use any products that contain nicotine or tobacco, such as cigarettes and e-cigarettes. If you need help quitting, ask your health care provider.  Keep all follow-up visits as told by your health care provider. This is important. Contact a health care provider if you have:  Symptoms that do not get better with treatment.  Pain or discomfort that gets worse.  More frequent urges to urinate.  A fever. Get help right away if:  You have no control over when you urinate. Summary  Interstitial cystitis is inflammation of the bladder.  This condition may cause pain in the bladder area as well as a frequent and urgent need to urinate.  You may have flare-ups of the condition, and then it may go away for a while. For many people, it becomes a long-term (chronic) problem.  There is no cure for interstitial cystitis, but treatment methods are available  to control your symptoms. This information is not intended to replace advice given to you by your health care provider. Make sure you discuss any questions you have with your health care provider. Document Released: 06/26/2004 Document Revised: 10/08/2017 Document Reviewed: 09/20/2017 Elsevier Patient Education  2020 [employer].

## 2019-08-01 NOTE — Progress Notes (Signed)
9/22/20204:16 PM  Dominique Burton 1966/07/20, 53 y.o., female [id number]  Chief Complaint  Patient presents with  . Urinary Tract Infection    Patient stated she has been having burning, frequancey, and odor. No discharge. Had UTI and did not finish the abx last visit and do not think uti was cleared up.    HPI:   Patient is a 53 y.o. female with past medical history significant for HTN, vitamin D deficiency who presents today for UTI  For the past month has been noticing urinary urgency with small amount of urine, no dysuria, hematuria She has suprapubic pressure, and urine has a strong odor She denies any vaginal discharge She has been trying pushing fluids and cranberry juice Denies any incontinence Partial hyst several years ago Denies any hot flashes/night sweats Vaginal dryness - replens works Normal libido No mood changes  Treated for UTI in march 2020, resolved with abx However ur cx mixed GU flora Nitrofurantoin - upset stomach  Depression screen [facility] 2/9 08/01/2019 07/07/2019 01/28/2019  Decreased Interest 0 0 0  Down, Depressed, Hopeless 0 0 0  PHQ - 2 Score 0 0 0    Fall Risk  08/01/2019 07/07/2019 01/28/2019 10/26/2018 04/23/2018  Falls in the past year? - 0 0 0 No  Number falls in past yr: 0 0 - - -  Injury with Fall? 0 0 - - -  Follow up Falls evaluation completed - Falls evaluation completed - -     No Known Allergies  Prior to Admission medications   Medication Sig Start Date End Date Taking? Authorizing Provider  cetirizine (ZYRTEC) 10 MG tablet Take 1 tablet (10 mg total) by mouth daily. 01/28/19  Yes Rutherford Guys, MD  famotidine (PEPCID) 20 MG tablet Take 1 tablet (20 mg total) by mouth 2 (two) times daily. 07/07/19  Yes Rutherford Guys, MD  fluticasone (FLONASE) 50 MCG/ACT nasal spray Place 1 spray into both nostrils 2 (two) times daily. 01/28/19  Yes Rutherford Guys, MD  lisinopril-hydrochlorothiazide (ZESTORETIC) 20-25 MG tablet Take 1 tablet by  mouth daily. 07/07/19  Yes Rutherford Guys, MD  nitrofurantoin, macrocrystal-monohydrate, (MACROBID) 100 MG capsule Take 1 capsule (100 mg total) by mouth 2 (two) times daily. 01/28/19  Yes Rutherford Guys, MD  potassium chloride SA (K-DUR) 20 MEQ tablet Take 1 tablet (20 mEq total) by mouth daily. 07/07/19  Yes Rutherford Guys, MD  Vitamin D, Ergocalciferol, (DRISDOL) 1.25 MG (50000 UT) CAPS capsule Take 1 capsule (50,000 Units total) by mouth every 7 (seven) days. 10/26/18  Yes Rutherford Guys, MD    Past Medical History:  Diagnosis Date  . Hypertension   . Vitamin D deficiency     Past Surgical History:  Procedure Laterality Date  . ABDOMINAL HYSTERECTOMY     PARTIAL  . CESAREAN SECTION      Social History   Tobacco Use  . Smoking status: Never Smoker  . Smokeless tobacco: Never Used  Substance Use Topics  . Alcohol use: No    Family History  Problem Relation Age of Onset  . Hypertension Mother   . Diabetes Father   . Hypertension Father   . Hypertension Sister   . Heart disease Brother   . Hypertension Brother   . Hypertension Maternal Grandmother   . Hypertension Maternal Grandfather     ROS Per hpi  OBJECTIVE:  Today's Vitals   08/01/19 1611 08/01/19 1617  BP: (!) 149/91 138/82  Pulse: 74  Resp: 14   Temp: 98.4 F (36.9 C)   TempSrc: Oral   SpO2: 98%   Weight: 242 lb 4.8 oz (109.9 kg)    Body mass index is 39.11 kg/m.   Physical Exam Vitals signs and nursing note reviewed.  Constitutional:      Appearance: She is well-developed.  HENT:     Head: Normocephalic and atraumatic.     Mouth/Throat:     Pharynx: No oropharyngeal exudate.  Eyes:     General: No scleral icterus.    Conjunctiva/sclera: Conjunctivae normal.     Pupils: Pupils are equal, round, and reactive to light.  Neck:     Musculoskeletal: Neck supple.  Cardiovascular:     Rate and Rhythm: Normal rate and regular rhythm.     Heart sounds: Normal heart sounds. No murmur. No  friction rub. No gallop.   Pulmonary:     Effort: Pulmonary effort is normal.     Breath sounds: Normal breath sounds. No wheezing or rales.  Abdominal:     General: Bowel sounds are normal.     Palpations: Abdomen is soft. There is no hepatomegaly or splenomegaly.     Tenderness: There is no abdominal tenderness. There is no right CVA tenderness or left CVA tenderness.  Skin:    General: Skin is warm and dry.  Neurological:     Mental Status: She is alert and oriented to person, place, and time.     Results for orders placed or performed in visit on 08/01/19 (from the past 24 hour(s))  POCT urinalysis dipstick     Status: Abnormal   Collection Time: 08/01/19  4:52 PM  Result Value Ref Range   Color, UA yellow yellow   Clarity, UA clear clear   Glucose, UA negative negative mg/dL   Bilirubin, UA negative negative   Ketones, POC UA trace (5) (A) negative mg/dL   Spec Grav, UA 8.299 3.716 - 1.025   Blood, UA moderate (A) negative   pH, UA 5.5 5.0 - 8.0   Protein Ur, POC negative negative mg/dL   Urobilinogen, UA 0.2 0.2 or 1.0 E.U./dL   Nitrite, UA Negative Negative   Leukocytes, UA Small (1+) (A) Negative  POCT Microscopic Urinalysis ([facility])     Status: Abnormal   Collection Time: 08/01/19  4:57 PM  Result Value Ref Range   WBC,UR,HPF,POC Moderate (A) None WBC/hpf   RBC,UR,HPF,POC Few (A) None RBC/hpf   Bacteria None None, Too numerous to count   Mucus Absent Absent   Epithelial Cells, UR Per Microscopy Few (A) None, Too numerous to count cells/hpf    No results found.   ASSESSMENT and PLAN  1. Urinary urgency UA/micro not suggestive of infection, cx pending. Trial of pyridium, reviewed r/se/b. Discussed pushing fluids, avoidance of bladder irritating bladder. Consider vaginal estrogen.  - POCT urinalysis dipstick - POCT Microscopic Urinalysis ([facility]) - Urine Culture  2. Need for prophylactic vaccination and inoculation against influenza - Flu Vaccine QUAD 6+ mos PF  IM (Fluarix Quad PF)  Other orders - phenazopyridine (PYRIDIUM) 200 MG tablet; Take 1 tablet (200 mg total) by mouth 3 (three) times daily as needed for pain.  Return if symptoms worsen or fail to improve.    Myles Lipps, MD Primary Care at [facility] [address] [place], [place] [address] Ph.  [phone] Fax [phone]

## 2019-08-02 DIAGNOSIS — Z23 Encounter for immunization: Secondary | ICD-10-CM | POA: Diagnosis not present

## 2019-08-02 LAB — URINE CULTURE

## 2019-08-23 IMAGING — MG DIGITAL SCREENING BILATERAL MAMMOGRAM WITH CAD
4 series · 4 of 4 positions shown · non-contrast
Comparison: Previous exam(s).

CLINICAL DATA: Screening.

EXAM:
DIGITAL SCREENING BILATERAL MAMMOGRAM WITH CAD

[L CC]
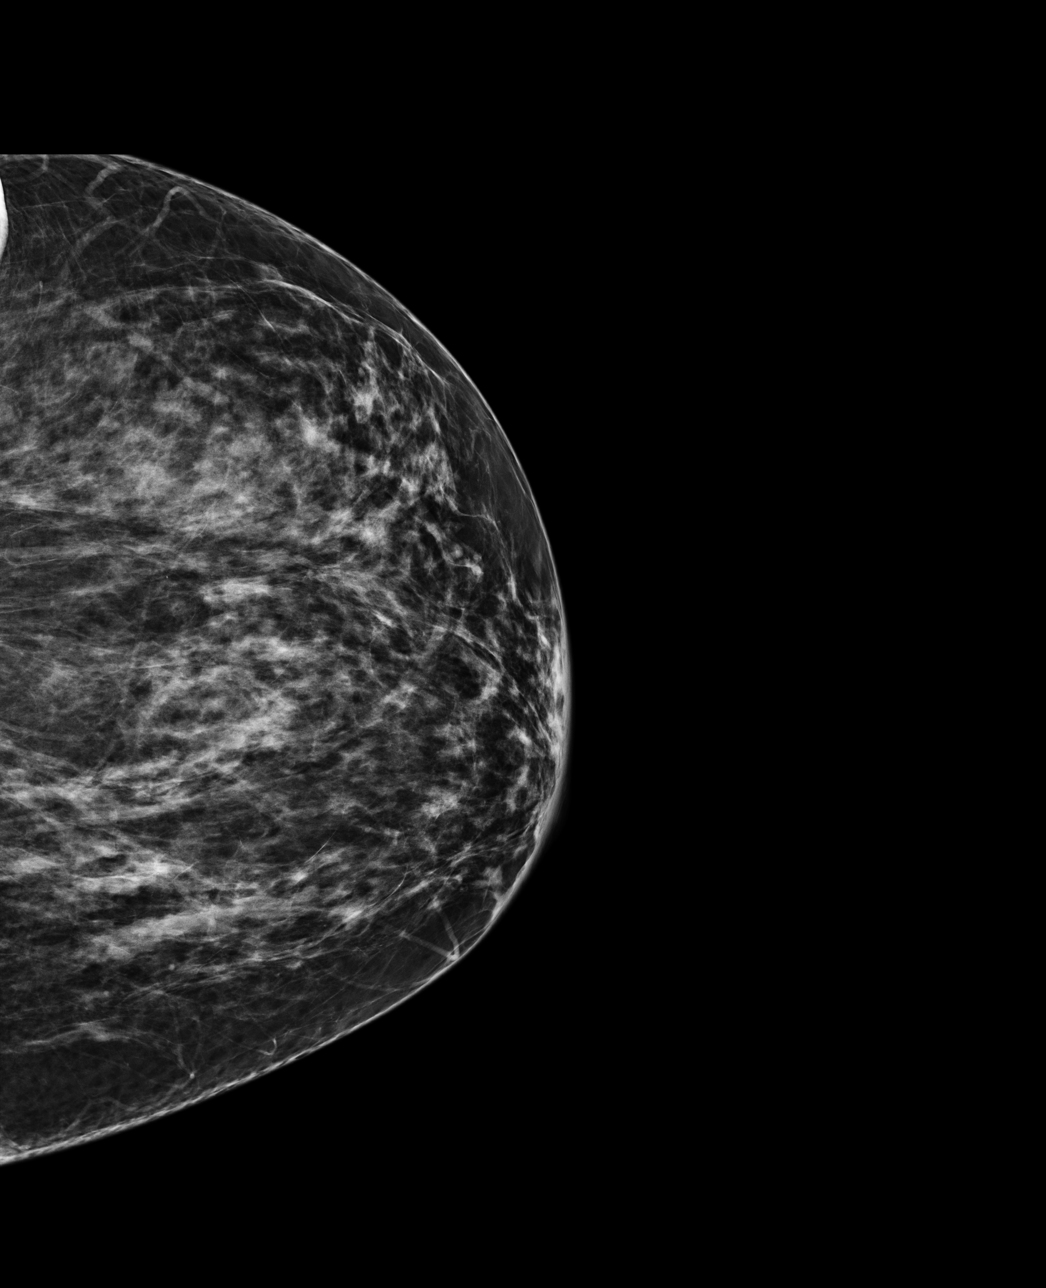

[L MLO]
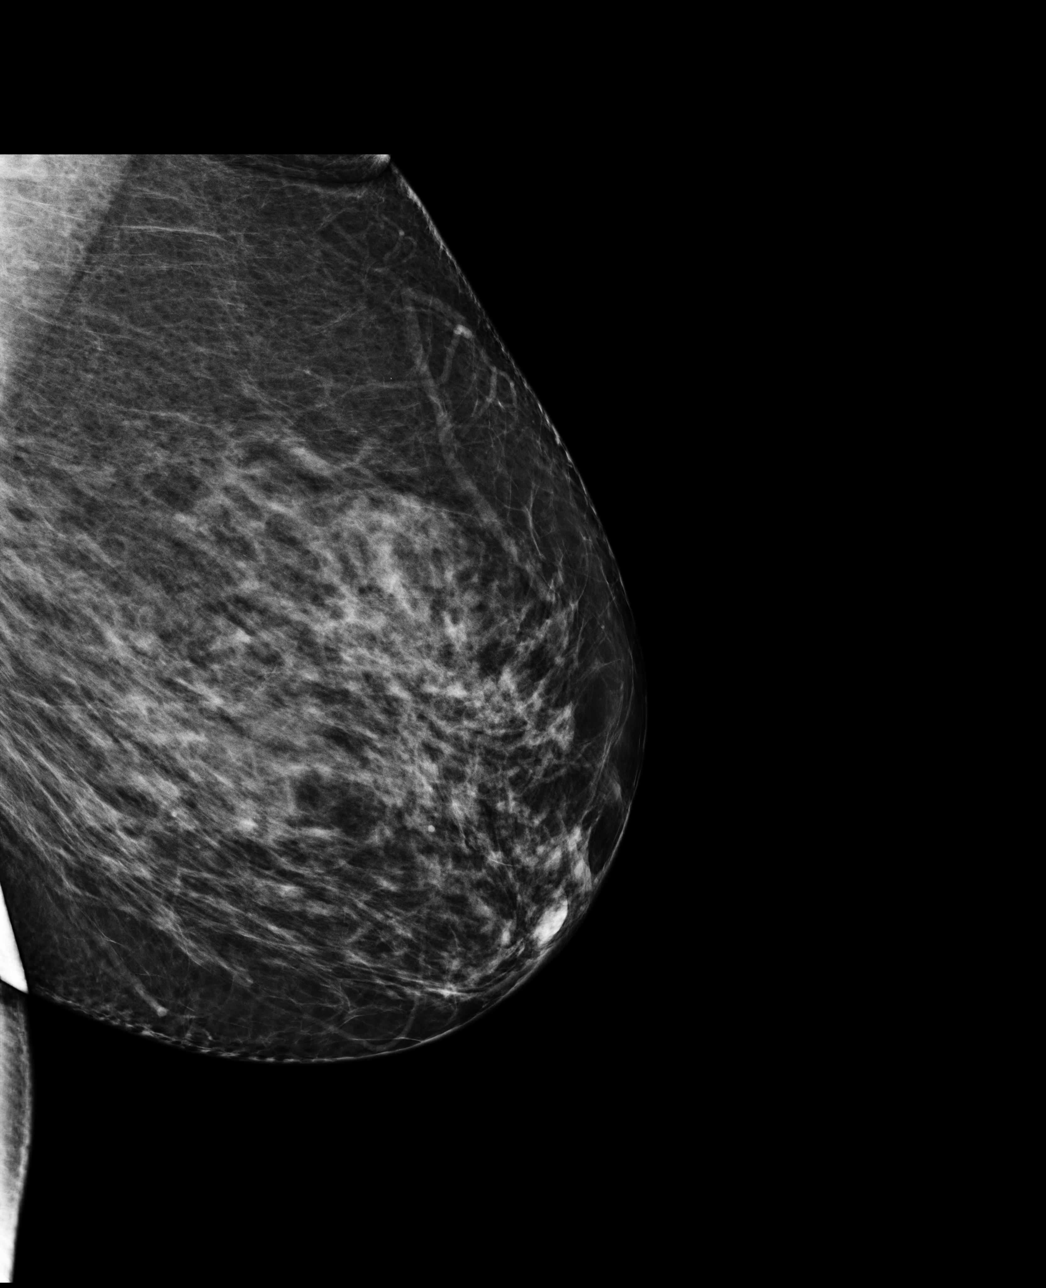

[R MLO]
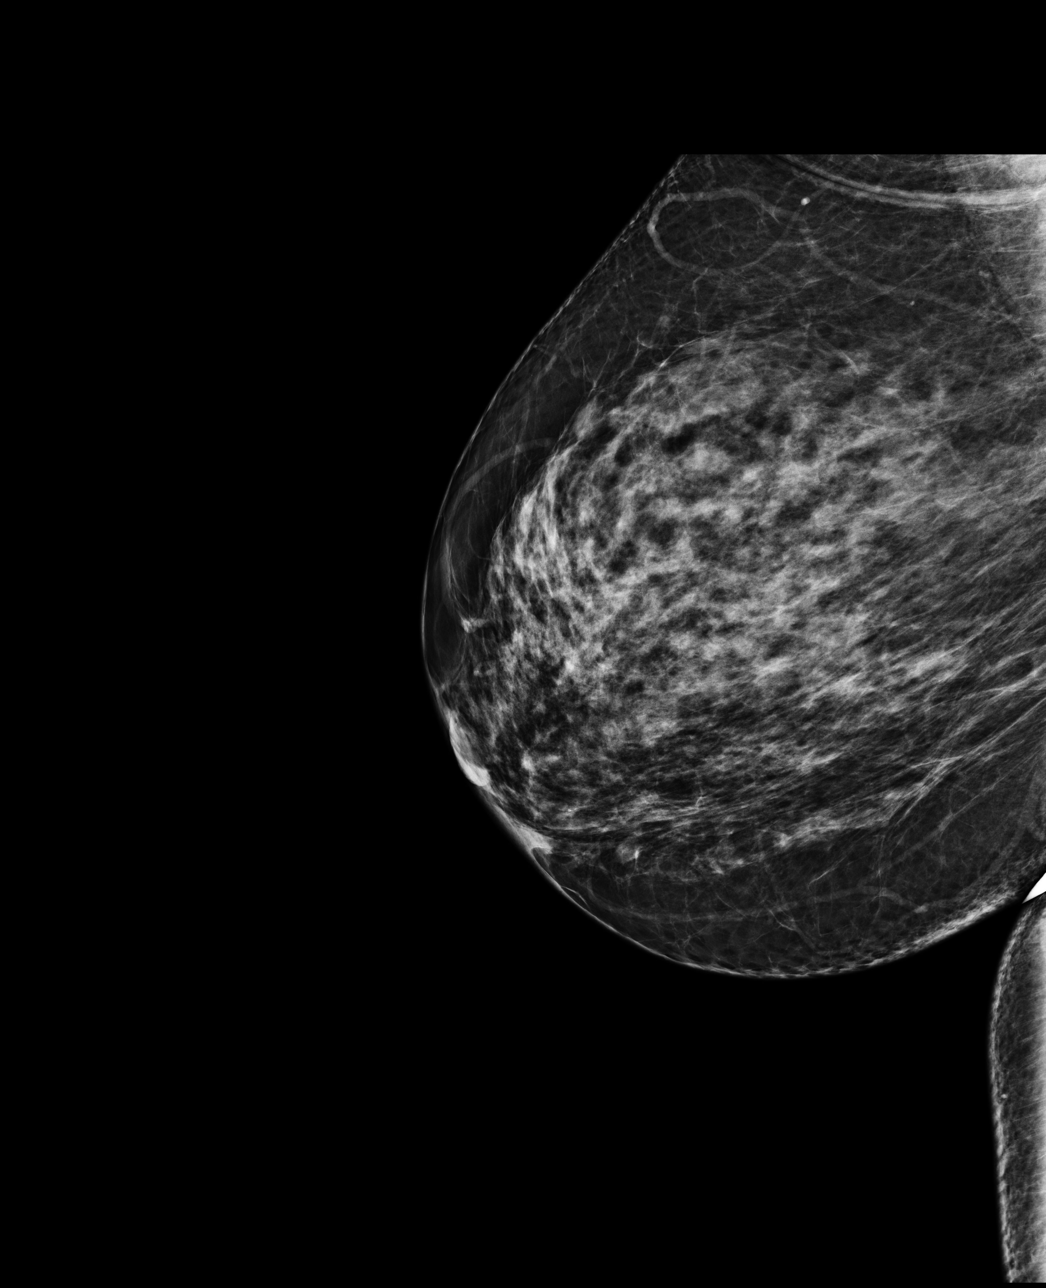

[R CC]
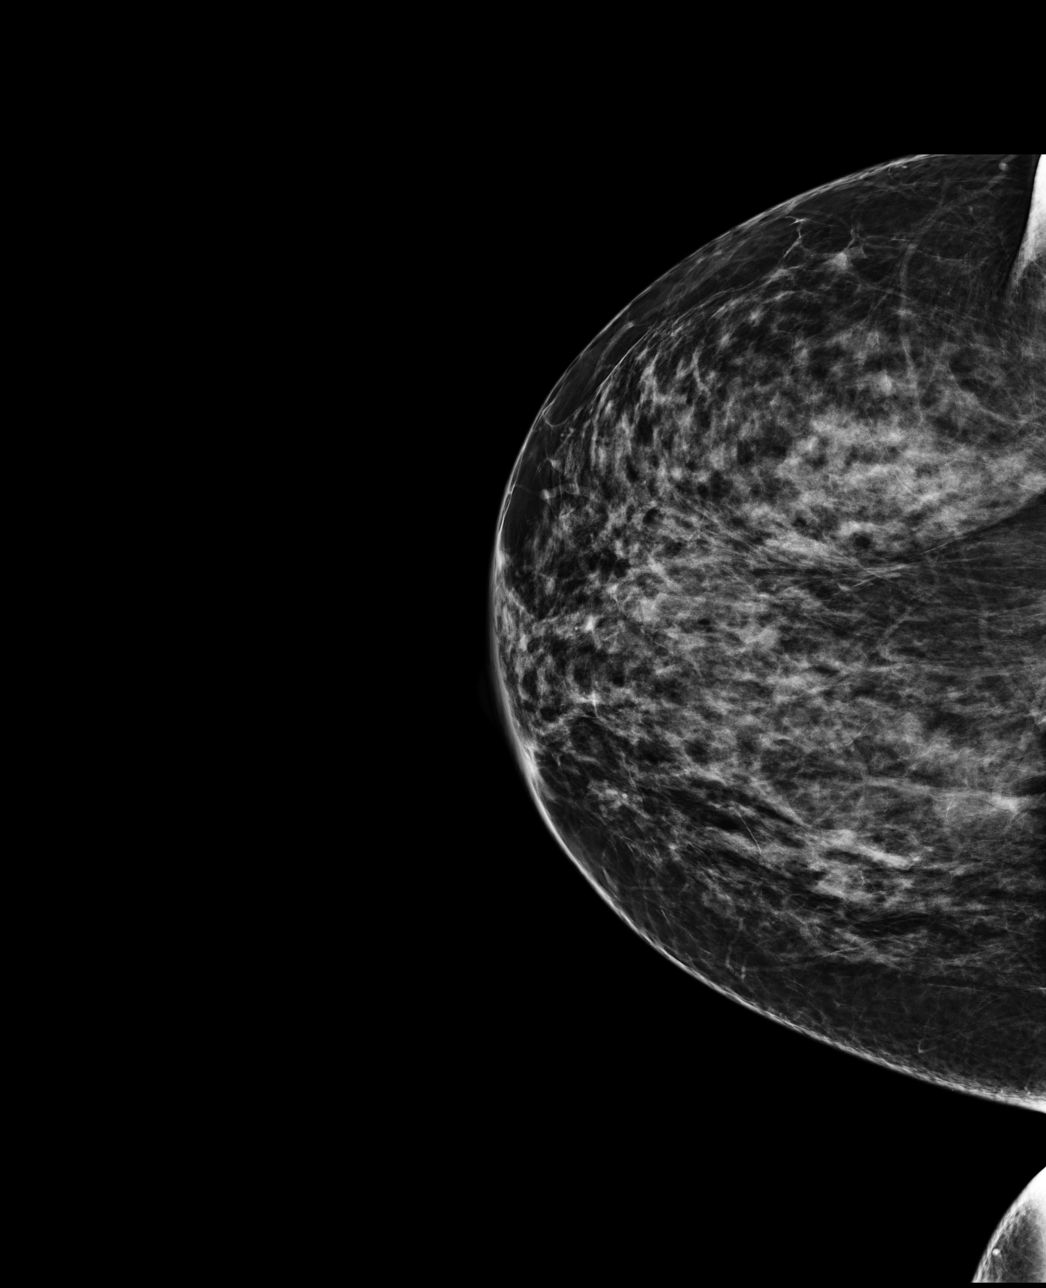

[4 of 4 positions shown; findings below may reference images not displayed]

ACR Breast Density Category c: The breast tissue is heterogeneously
dense, which may obscure small masses.
FINDINGS: There are no findings suspicious for malignancy. Images were
processed with CAD.
IMPRESSION: No mammographic evidence of malignancy. A result letter of this
screening mammogram will be mailed directly to the patient.

RECOMMENDATION:
Screening mammogram in one year. (Code:[id number])

BI-RADS CATEGORY  1: Negative.

## 2019-09-18 ENCOUNTER — Ambulatory Visit: Payer: 59

## 2019-09-19 ENCOUNTER — Other Ambulatory Visit: Payer: Self-pay

## 2019-09-19 ENCOUNTER — Ambulatory Visit (INDEPENDENT_AMBULATORY_CARE_PROVIDER_SITE_OTHER): Payer: 59 | Admitting: Family Medicine

## 2019-09-19 DIAGNOSIS — Z111 Encounter for screening for respiratory tuberculosis: Secondary | ICD-10-CM

## 2019-09-19 NOTE — Patient Instructions (Addendum)

## 2019-09-21 ENCOUNTER — Ambulatory Visit (INDEPENDENT_AMBULATORY_CARE_PROVIDER_SITE_OTHER): Payer: 59 | Admitting: Family Medicine

## 2019-09-21 ENCOUNTER — Other Ambulatory Visit: Payer: Self-pay

## 2019-09-21 DIAGNOSIS — Z111 Encounter for screening for respiratory tuberculosis: Secondary | ICD-10-CM

## 2019-09-21 LAB — TB SKIN TEST
Induration: 0 mm
TB Skin Test: NEGATIVE

## 2019-09-21 NOTE — Progress Notes (Signed)
Pt came in the office for TB reading--negative. Pt given the copy of the TB results.

## 2019-10-10 ENCOUNTER — Other Ambulatory Visit: Payer: Self-pay

## 2019-10-10 ENCOUNTER — Telehealth (INDEPENDENT_AMBULATORY_CARE_PROVIDER_SITE_OTHER): Payer: 59 | Admitting: Family Medicine

## 2019-10-10 DIAGNOSIS — E876 Hypokalemia: Secondary | ICD-10-CM | POA: Diagnosis not present

## 2019-10-10 DIAGNOSIS — E559 Vitamin D deficiency, unspecified: Secondary | ICD-10-CM | POA: Diagnosis not present

## 2019-10-10 DIAGNOSIS — I10 Essential (primary) hypertension: Secondary | ICD-10-CM | POA: Diagnosis not present

## 2019-10-10 MED ORDER — POTASSIUM CHLORIDE CRYS ER 20 MEQ PO TBCR: 20.0000 meq | EXTENDED_RELEASE_TABLET | Freq: Every day | ORAL | 3 refills | Status: DC

## 2019-10-10 MED ORDER — LISINOPRIL-HYDROCHLOROTHIAZIDE 20-25 MG PO TABS: 1.0000 | ORAL_TABLET | Freq: Every day | ORAL | 3 refills | Status: DC

## 2019-10-10 NOTE — Progress Notes (Signed)
Virtual Visit Note  I connected with patient on 10/10/19 at 935am by phone and verified that I am speaking with the correct person using two identifiers. Dominique Burton is currently located at home and patient is currently with them during visit. The provider, Rutherford Guys, MD is located in their office at time of visit.  I discussed the limitations, risks, security and privacy concerns of performing an evaluation and management service by telephone and the availability of in person appointments. I also discussed with the patient that there may be a patient responsible charge related to this service. The patient expressed understanding and agreed to proceed.   CC: HTN  And vitamin D  HPI ? Checks BP every morning Today 120/82, O2 98, T 97.5 BP has been controlled at home Takes lisinopril and HCTZ with KCL Tolerates well, denies any side effects  Not taking a daily vitamin D supplement since she completed her high dose   No fever, chills, cough, SOB, chest pain, palpitations, edema, nausea, vomiting  Lab Results  Component Value Date   CREATININE 0.75 07/12/2019   BUN 12 07/12/2019   NA 141 07/12/2019   K 3.7 07/12/2019   CL 102 07/12/2019   CO2 25 07/12/2019   Vitamin D 28  No Known Allergies  Prior to Admission medications   Medication Sig Start Date End Date Taking? Authorizing Provider  famotidine (PEPCID) 20 MG tablet Take 1 tablet (20 mg total) by mouth 2 (two) times daily. 07/07/19  Yes Rutherford Guys, MD  lisinopril-hydrochlorothiazide (ZESTORETIC) 20-25 MG tablet Take 1 tablet by mouth daily. 07/07/19  Yes Rutherford Guys, MD  potassium chloride SA (K-DUR) 20 MEQ tablet Take 1 tablet (20 mEq total) by mouth daily. 07/07/19  Yes Rutherford Guys, MD  cetirizine (ZYRTEC) 10 MG tablet Take 1 tablet (10 mg total) by mouth daily. Patient not taking: Reported on 10/10/2019 01/28/19   Rutherford Guys, MD  fluticasone [facility]) 50 MCG/ACT nasal spray Place 1 spray into  both nostrils 2 (two) times daily. Patient not taking: Reported on 10/10/2019 01/28/19   Rutherford Guys, MD    Past Medical History:  Diagnosis Date   Hypertension    Vitamin D deficiency     Past Surgical History:  Procedure Laterality Date   ABDOMINAL HYSTERECTOMY     PARTIAL   CESAREAN SECTION      Social History   Tobacco Use   Smoking status: Never Smoker   Smokeless tobacco: Never Used  Substance Use Topics   Alcohol use: No    Family History  Problem Relation Age of Onset   Hypertension Mother    Diabetes Father    Hypertension Father    Hypertension Sister    Heart disease Brother    Hypertension Brother    Hypertension Maternal Grandmother    Hypertension Maternal Grandfather     ROS Per hpi  Objective  Vitals as reported by the patient: as above  Gen: AA0x3, NAD Speaking comfortably in full sentences    ASSESSMENT and PLAN  1. Essential hypertension Controlled. Continue current regime.  - potassium chloride SA (KLOR-CON) 20 MEQ tablet; Take 1 tablet (20 mEq total) by mouth daily.  2. Vitamin D deficiency Discussed starting daily supplement 2000 units a day  3. Hypokalemia Controlled. Continue current regime.   Other orders - lisinopril-hydrochlorothiazide (ZESTORETIC) 20-25 MG tablet; Take 1 tablet by mouth daily.  FOLLOW-UP: 3 months   The above assessment and management plan  was discussed with the patient. The patient verbalized understanding of and has agreed to the management plan. Patient is aware to call the clinic if symptoms persist or worsen. Patient is aware when to return to the clinic for a follow-up visit. Patient educated on when it is appropriate to go to the emergency department.    I provided 16 minutes of non-face-to-face time during this encounter.  Myles Lipps, MD Primary Care at [facility] [address] [place], [place] [address] Ph.  [phone] Fax [phone]

## 2019-10-10 NOTE — Progress Notes (Signed)
Having no medical concerns today. Need refill on Lisinopril and potassium only, pharmacy verified. Monitors bp daily, numbers are in normal range. o2 is 98.

## 2019-10-10 NOTE — Patient Instructions (Signed)
Calcium Content in Foods Calcium is the most abundant mineral in your body. Most of your body's calcium supply is stored in your bones and teeth. Calcium helps many parts of the body function normally, including:  Blood and blood vessels.  Nerves.  Hormones.  Muscles.  Bones and teeth. When your calcium stores are low, you may be at risk for low bone mass, bone loss, and broken bones (fractures). When you get enough calcium, it helps to support strong bones and teeth throughout your life. Calcium is especially important for:  Children during growth spurts.  Girls during adolescence.  Women who are pregnant or breastfeeding.  Women after their menstrual cycle stops (postmenopause).  Women whose menstrual cycle has stopped due to anorexia nervosa or regular intense exercise.  People who cannot eat or digest dairy products.  Vegans. What are tips for getting more calcium? General information  Try to get most of your calcium from food. Eat foods that are high in calcium.  Some people may benefit from taking calcium supplements. Check with your health care provider or diet and nutrition specialist (dietitian) before starting any calcium supplements. Calcium supplements may interact with certain medicines. Too much calcium may cause other health problems, like constipation and kidney stones.  For the body to absorb calcium, it needs vitamin D. Sources of vitamin D include: ? Skin exposure to direct sunlight. ? Foods, such as egg yolks, liver, saltwater fish, and fortified milk. ? Vitamin D supplements. Check with your health care provider or dietitian before starting any vitamin D supplements. What foods are high in calcium?  High-calcium foods are those that contain more than 100 milligrams (mg) of calcium per serving. Fruits  Fortified orange or other fruit juice, 300 mg per 8 oz serving. Vegetables  Collard greens, 360 mg per 8 oz serving.  Kale, 180 mg per 8 oz serving.   Bok choy, 160 mg per 8 oz serving. Grains  Fortified ready-to-eat cereals, 100-1,000 mg per 8 oz serving.  Fortified frozen waffles, 200 mg in two waffles. Meats and other proteins  Sardines, canned with bones, 325 mg per 3 oz serving.  Salmon, canned with bones, 180 mg per 3 oz serving.  Canned shrimp, 125 mg per 3 oz serving.  Baked beans, 160 mg per 4 oz serving. Dairy  Yogurt, plain, low-fat, 310 mg per 6 oz serving.  Milk, 300 mg per 8 oz serving.  American cheese, 195 mg per 1 oz serving.  Cheddar cheese, 205 mg per 1 oz serving.  Cottage cheese 2%, 105 mg per 4 oz serving.  Fortified soy, rice, or almond milk, 300 mg per 8 oz serving. The items listed above may not be a complete list of foods high in calcium. Actual amounts of calcium may be different depending on processing. Contact a dietitian for more information. What foods are lower in calcium? Foods lower in calcium are those that contain 50 mg of calcium or less per serving. Fruits  Apple, about 6 mg in one apple.  Banana, about 12 mg in one banana. Vegetables  Lettuce, 19 mg per 2 oz serving.  Tomato, about 11 mg in one tomato. Grains  Rice, 4 mg per 6 oz serving.  Boiled potatoes, 14 mg per 8 oz serving.  White bread, 6 mg in one slice. Meats and other proteins  Egg, 27 mg per 2 oz serving.  Red meat, 7 mg per 4 oz serving.  Chicken, 17 mg per 4 oz serving.  Fish, cod  or trout, 20 mg per 4 oz serving. The items listed above may not be a complete list of foods lower in calcium. Actual amounts of calcium may be different depending on processing. Contact a dietitian for more information. Summary  Calcium is an important mineral in the body because it affects many functions. Getting enough calcium helps support strong bones and teeth throughout your life.  Try to get most of your calcium from food.  Calcium supplements may interact with certain medicines. Check with your health care  provider before starting any calcium supplements. This information is not intended to replace advice given to you by your health care provider. Make sure you discuss any questions you have with your health care provider. Document Released: 06/09/2004 Document Revised: 10/19/2017 Document Reviewed: 10/19/2017 Elsevier Patient Education  2020 [employer].

## 2019-12-01 ENCOUNTER — Other Ambulatory Visit: Payer: Self-pay | Admitting: Family Medicine

## 2019-12-01 DIAGNOSIS — Z1231 Encounter for screening mammogram for malignant neoplasm of breast: Secondary | ICD-10-CM

## 2020-01-12 ENCOUNTER — Ambulatory Visit
Admission: RE | Admit: 2020-01-12 | Discharge: 2020-01-12 | Disposition: A | Discharge status: Home / Self Care | Payer: 59 | Source: Ambulatory Visit | Attending: Family Medicine | Admitting: Family Medicine

## 2020-01-12 ENCOUNTER — Other Ambulatory Visit: Payer: Self-pay

## 2020-01-12 DIAGNOSIS — Z1231 Encounter for screening mammogram for malignant neoplasm of breast: Secondary | ICD-10-CM

## 2020-01-14 ENCOUNTER — Ambulatory Visit: Payer: 59 | Attending: Internal Medicine

## 2020-01-14 DIAGNOSIS — Z23 Encounter for immunization: Secondary | ICD-10-CM | POA: Insufficient documentation

## 2020-01-14 NOTE — Progress Notes (Signed)
   Covid-19 Vaccination Clinic  Name:  Dominique Burton    MRN: [id number] DOB: [birth year]  01/14/2020  [name] was observed post Covid-19 immunization for 15 minutes without incident. She was provided with Vaccine Information Sheet and instruction to access the V-Safe system.   [name] was instructed to call 911 with any severe reactions post vaccine: Marland Kitchen Difficulty breathing  . Swelling of face and throat  . A fast heartbeat  . A bad rash all over body  . Dizziness and weakness   Immunizations Administered    Name Date Dose VIS Date Route   Pfizer COVID-19 Vaccine 01/14/2020  8:44 AM 0.3 mL 10/20/2019 Intramuscular   Manufacturer: [employer], [employer]   Lot: [id number]   NDC: [address]-5

## 2020-01-19 ENCOUNTER — Telehealth: Payer: Self-pay | Admitting: Family Medicine

## 2020-01-19 ENCOUNTER — Ambulatory Visit (INDEPENDENT_AMBULATORY_CARE_PROVIDER_SITE_OTHER): Payer: 59 | Admitting: Family Medicine

## 2020-01-19 ENCOUNTER — Encounter: Payer: Self-pay | Admitting: Family Medicine

## 2020-01-19 ENCOUNTER — Other Ambulatory Visit: Payer: Self-pay

## 2020-01-19 VITALS — BP 158/98 | HR 73 | Temp 98.0°F | Resp 18 | Ht 66.0 in | Wt 243.0 lb

## 2020-01-19 DIAGNOSIS — F4322 Adjustment disorder with anxiety: Secondary | ICD-10-CM

## 2020-01-19 DIAGNOSIS — I10 Essential (primary) hypertension: Secondary | ICD-10-CM | POA: Diagnosis not present

## 2020-01-19 DIAGNOSIS — E876 Hypokalemia: Secondary | ICD-10-CM | POA: Diagnosis not present

## 2020-01-19 DIAGNOSIS — Z862 Personal history of diseases of the blood and blood-forming organs and certain disorders involving the immune mechanism: Secondary | ICD-10-CM

## 2020-01-19 DIAGNOSIS — E559 Vitamin D deficiency, unspecified: Secondary | ICD-10-CM

## 2020-01-19 MED ORDER — AMLODIPINE BESYLATE 5 MG PO TABS: 5.0000 mg | ORAL_TABLET | Freq: Every day | ORAL | 0 refills | Status: DC

## 2020-01-19 MED ORDER — ESCITALOPRAM OXALATE 5 MG PO TABS: 5.0000 mg | ORAL_TABLET | Freq: Every day | ORAL | 2 refills | Status: DC

## 2020-01-19 MED ORDER — POTASSIUM CHLORIDE CRYS ER 20 MEQ PO TBCR: 20.0000 meq | EXTENDED_RELEASE_TABLET | Freq: Every day | ORAL | 0 refills | Status: DC

## 2020-01-19 MED ORDER — LISINOPRIL 20 MG PO TABS: 20.0000 mg | ORAL_TABLET | Freq: Every day | ORAL | 0 refills | Status: DC

## 2020-01-19 NOTE — Progress Notes (Signed)
3/12/20219:09 AM  Dominique Burton 09-Dec-1965, 54 y.o., female [id number]  Chief Complaint  Patient presents with  . Hypertension    medication refills, requests potassium check- leg cramping related to hctz  . lab requests    check calcium, hormones    HPI:   Patient is a 54 y.o. female with past medical history significant for HTN, hypokalemia, vitamin D deficiency, h/o iron def anemia, GERD who presents today for routine followup  Last OV dec 2020 - telemedicine Started OTC D3 2000 units a day  She has been skipping BP meds as she has been worried about her potassium going low due to recent increasing leg cramps Having issues with vaginal dryness, has not been having using replens, having increased anxiety, not many hot flashes Has had first covid vaccine  Wt Readings from Last 3 Encounters:  01/19/20 243 lb (110.2 kg)  08/01/19 242 lb 4.8 oz (109.9 kg)  01/28/19 237 lb (107.5 kg)   BP Readings from Last 3 Encounters:  01/19/20 (!) 158/98  08/01/19 138/82  01/28/19 134/86   Lab Results  Component Value Date   CREATININE 0.75 07/12/2019   BUN 12 07/12/2019   NA 141 07/12/2019   K 3.7 07/12/2019   CL 102 07/12/2019   CO2 25 07/12/2019   Lab Results  Component Value Date   CHOL 150 07/12/2019   HDL 53 07/12/2019   LDLCALC 75 07/12/2019   TRIG 122 07/12/2019   CHOLHDL 2.8 07/12/2019   Lab Results  Component Value Date   WBC 7.1 07/12/2019   HGB 13.2 07/12/2019   HCT 40.5 07/12/2019   MCV 87 07/12/2019   PLT 128 (L) 07/12/2019   Depression screen PHQ 2/9 01/19/2020 10/10/2019 08/01/2019  Decreased Interest 0 0 0  Down, Depressed, Hopeless 0 0 0  PHQ - 2 Score 0 0 0    Fall Risk  01/19/2020 10/10/2019 08/01/2019 07/07/2019 01/28/2019  Falls in the past year? 0 0 - 0 0  Number falls in past yr: 0 0 0 0 -  Injury with Fall? 0 0 0 0 -  Follow up Falls evaluation completed - Falls evaluation completed - Falls evaluation completed     No Known Allergies  Prior  to Admission medications   Medication Sig Start Date End Date Taking? Authorizing Provider  famotidine (PEPCID) 20 MG tablet Take 1 tablet (20 mg total) by mouth 2 (two) times daily. 07/07/19  Yes Rutherford Guys, MD  lisinopril-hydrochlorothiazide (ZESTORETIC) 20-25 MG tablet Take 1 tablet by mouth daily. 10/10/19  Yes Rutherford Guys, MD  potassium chloride SA (KLOR-CON) 20 MEQ tablet Take 1 tablet (20 mEq total) by mouth daily. 10/10/19  Yes Rutherford Guys, MD  cetirizine (ZYRTEC) 10 MG tablet Take 1 tablet (10 mg total) by mouth daily. Patient not taking: Reported on 10/10/2019 01/28/19   Rutherford Guys, MD  fluticasone [facility]) 50 MCG/ACT nasal spray Place 1 spray into both nostrils 2 (two) times daily. Patient not taking: Reported on 10/10/2019 01/28/19   Rutherford Guys, MD    Past Medical History:  Diagnosis Date  . Hypertension   . Vitamin D deficiency     Past Surgical History:  Procedure Laterality Date  . ABDOMINAL HYSTERECTOMY     PARTIAL  . CESAREAN SECTION      Social History   Tobacco Use  . Smoking status: Never Smoker  . Smokeless tobacco: Never Used  Substance Use Topics  . Alcohol use: No  Family History  Problem Relation Age of Onset  . Hypertension Mother   . Diabetes Father   . Hypertension Father   . Hypertension Sister   . Heart disease Brother   . Hypertension Brother   . Hypertension Maternal Grandmother   . Hypertension Maternal Grandfather     Review of Systems  Constitutional: Negative for chills and fever.  Respiratory: Negative for cough and shortness of breath.   Cardiovascular: Negative for chest pain, palpitations and leg swelling.  Gastrointestinal: Negative for abdominal pain, nausea and vomiting.  per hpi   OBJECTIVE:  Today's Vitals   01/19/20 0904  BP: (!) 156/93  Pulse: 73  Resp: 18  Temp: 98 F (36.7 C)  TempSrc: Temporal  SpO2: 98%  Weight: 243 lb (110.2 kg)  Height: '5\' 6"'  (1.676 m)   Body mass index is  39.22 kg/m.   Physical Exam Vitals and nursing note reviewed.  Constitutional:      Appearance: She is well-developed.  HENT:     Head: Normocephalic and atraumatic.     Mouth/Throat:     Pharynx: No oropharyngeal exudate.  Eyes:     General: No scleral icterus.    Conjunctiva/sclera: Conjunctivae normal.     Pupils: Pupils are equal, round, and reactive to light.  Cardiovascular:     Rate and Rhythm: Normal rate and regular rhythm.     Heart sounds: Normal heart sounds. No murmur. No friction rub. No gallop.   Pulmonary:     Effort: Pulmonary effort is normal.     Breath sounds: Normal breath sounds. No wheezing or rales.  Musculoskeletal:     Cervical back: Neck supple.     Right lower leg: No edema.     Left lower leg: No edema.  Skin:    General: Skin is warm and dry.  Neurological:     Mental Status: She is alert and oriented to person, place, and time.     No results found for this or any previous visit (from the past 24 hour(s)).  No results found.   ASSESSMENT and PLAN  1. Essential hypertension Above goal in setting of being of meds due to concerns of side effects. Dx hctz. Continue lisinopril. Start amlodipine. Cont home bp monitoring. Discussed LFM, weight loss, calorie counting - CBC - TSH - Lipid panel - CMP14+EGFR - potassium chloride SA (KLOR-CON) 20 MEQ tablet; Take 1 tablet (20 mEq total) by mouth daily.  2. Adjustment disorder with anxious mood Discussed treatment options. Start lexapro, reviewed r/se/b  3. Vitamin D deficiency Checking labs today, medications will be adjusted as needed.  - VITAMIN D 25 Hydroxy (Vit-D Deficiency, Fractures)  4. Hypokalemia Checking labs, supplementation will be given as needed. D/c HCTZ.   5. H/O iron deficiency anemia Cbc pending  Other orders - lisinopril (ZESTRIL) 20 MG tablet; Take 1 tablet (20 mg total) by mouth daily. - amLODipine (NORVASC) 5 MG tablet; Take 1 tablet (5 mg total) by mouth  daily. - escitalopram (LEXAPRO) 5 MG tablet; Take 1 tablet (5 mg total) by mouth at bedtime.  Return in about 4 weeks (around 02/16/2020).    Rutherford Guys, MD [facility] [place], [place] [address] Ph.  [phone] Fax [phone]

## 2020-01-19 NOTE — Telephone Encounter (Signed)
Per initial encounter, Pharmacy called and is requesting to have clarification on pts lisinopril and a new dose was sent in for her today. Pharmacy is requesting to know if pt should stop taking the one from before. Please advise.     [facility] [address], [place] - [address]  [address] [place] [address]  Phone: [phone] Fax: [phone]  Not a 24 hour pharmacy; exact hours not known.    The pt is seen by Dr Leretha Pol, Ernesto Rutherford; will route to office for final disposition.

## 2020-01-19 NOTE — Telephone Encounter (Signed)
I have called the pharmacy and pt is suppose to D/C the lisinopril hctz and continue the Lisinopril. Pt is to continue the new medication.   Pharmacy stated understanding.

## 2020-01-19 NOTE — Telephone Encounter (Signed)
Pharmacy called and is requesting to have clarification on pts lisinopril and a new dose was sent in for her today. Pharmacy is requesting to know if pt should stop taking the one from before. Please advise.     [facility] [address] [place], [place] - [address]  [address] [place] [address]  Phone: [phone] Fax: [phone]  Not a 24 hour pharmacy; exact hours not known.

## 2020-01-19 NOTE — Patient Instructions (Addendum)
If you have lab work done today you will be contacted with your lab results within the next 2 weeks.  If you have not heard from [place] then please contact us. The fastest way to get your results is to register for My Chart.   IF you received an x-ray today, you will receive an invoice from [facility]. Please contact [facility] at [phone] with questions or concerns regarding your invoice.   IF you received labwork today, you will receive an invoice from [facility]. Please contact [facility] at [phone] with questions or concerns regarding your invoice.   Our billing staff will not be able to assist you with questions regarding bills from these companies.  You will be contacted with the lab results as soon as they are available. The fastest way to get your results is to activate your My Chart account. Instructions are located on the last page of this paperwork. If you have not heard from [place] regarding the results in 2 weeks, please contact this office.      Calorie Counting for Weight Loss Calories are units of energy. Your body needs a certain amount of calories from food to keep you going throughout the day. When you eat more calories than your body needs, your body stores the extra calories as fat. When you eat fewer calories than your body needs, your body burns fat to get the energy it needs. Calorie counting means keeping track of how many calories you eat and drink each day. Calorie counting can be helpful if you need to lose weight. If you make sure to eat fewer calories than your body needs, you should lose weight. Ask your health care provider what a healthy weight is for you. For calorie counting to work, you will need to eat the right number of calories in a day in order to lose a healthy amount of weight per week. A dietitian can help you determine how many calories you need in a day and will give you suggestions on how to reach your calorie goal.  A healthy  amount of weight to lose per week is usually 1-2 lb (0.5-0.9 kg). This usually means that your daily calorie intake should be reduced by 500-750 calories.  Eating 1,200 - 1,500 calories per day can help most women lose weight.  Eating 1,500 - 1,800 calories per day can help most men lose weight. What is my plan? My goal is to have _____1400_____ calories per day. If I have this many calories per day, I should lose around ____1______ pounds per week. What do I need to know about calorie counting? In order to meet your daily calorie goal, you will need to:  Find out how many calories are in each food you would like to eat. Try to do this before you eat.  Decide how much of the food you plan to eat.  Write down what you ate and how many calories it had. Doing this is called keeping a food log. To successfully lose weight, it is important to balance calorie counting with a healthy lifestyle that includes regular activity. Aim for 150 minutes of moderate exercise (such as walking) or 75 minutes of vigorous exercise (such as running) each week. Where do I find calorie information?  The number of calories in a food can be found on a Nutrition Facts label. If a food does not have a Nutrition Facts label, try to look up the calories online or ask your dietitian  for help. Remember that calories are listed per serving. If you choose to have more than one serving of a food, you will have to multiply the calories per serving by the amount of servings you plan to eat. For example, the label on a package of bread might say that a serving size is 1 slice and that there are 90 calories in a serving. If you eat 1 slice, you will have eaten 90 calories. If you eat 2 slices, you will have eaten 180 calories. How do I keep a food log? Immediately after each meal, record the following information in your food log:  What you ate. Don't forget to include toppings, sauces, and other extras on the food.  How much  you ate. This can be measured in cups, ounces, or number of items.  How many calories each food and drink had.  The total number of calories in the meal. Keep your food log near you, such as in a small notebook in your pocket, or use a mobile app or website. Some programs will calculate calories for you and show you how many calories you have left for the day to meet your goal. What are some calorie counting tips?   Use your calories on foods and drinks that will fill you up and not leave you hungry: ? Some examples of foods that fill you up are nuts and nut butters, vegetables, lean proteins, and high-fiber foods like whole grains. High-fiber foods are foods with more than 5 g fiber per serving. ? Drinks such as sodas, specialty coffee drinks, alcohol, and juices have a lot of calories, yet do not fill you up.  Eat nutritious foods and avoid empty calories. Empty calories are calories you get from foods or beverages that do not have many vitamins or protein, such as candy, sweets, and soda. It is better to have a nutritious high-calorie food (such as an avocado) than a food with few nutrients (such as a bag of chips).  Know how many calories are in the foods you eat most often. This will help you calculate calorie counts faster.  Pay attention to calories in drinks. Low-calorie drinks include water and unsweetened drinks.  Pay attention to nutrition labels for "low fat" or "fat free" foods. These foods sometimes have the same amount of calories or more calories than the full fat versions. They also often have added sugar, starch, or salt, to make up for flavor that was removed with the fat.  Find a way of tracking calories that works for you. Get creative. Try different apps or programs if writing down calories does not work for you. What are some portion control tips?  Know how many calories are in a serving. This will help you know how many servings of a certain food you can have.  Use a  measuring cup to measure serving sizes. You could also try weighing out portions on a kitchen scale. With time, you will be able to estimate serving sizes for some foods.  Take some time to put servings of different foods on your favorite plates, bowls, and cups so you know what a serving looks like.  Try not to eat straight from a bag or box. Doing this can lead to overeating. Put the amount you would like to eat in a cup or on a plate to make sure you are eating the right portion.  Use smaller plates, glasses, and bowls to prevent overeating.  Try not to multitask (for  example, watch TV or use your computer) while eating. If it is time to eat, sit down at a table and enjoy your food. This will help you to know when you are full. It will also help you to be aware of what you are eating and how much you are eating. What are tips for following this plan? Reading food labels  Check the calorie count compared to the serving size. The serving size may be smaller than what you are used to eating.  Check the source of the calories. Make sure the food you are eating is high in vitamins and protein and low in saturated and trans fats. Shopping  Read nutrition labels while you shop. This will help you make healthy decisions before you decide to purchase your food.  Make a grocery list and stick to it. Cooking  Try to cook your favorite foods in a healthier way. For example, try baking instead of frying.  Use low-fat dairy products. Meal planning  Use more fruits and vegetables. Half of your plate should be fruits and vegetables.  Include lean proteins like poultry and fish. How do I count calories when eating out?  Ask for smaller portion sizes.  Consider sharing an entree and sides instead of getting your own entree.  If you get your own entree, eat only half. Ask for a box at the beginning of your meal and put the rest of your entree in it so you are not tempted to eat it.  If calories  are listed on the menu, choose the lower calorie options.  Choose dishes that include vegetables, fruits, whole grains, low-fat dairy products, and lean protein.  Choose items that are boiled, broiled, grilled, or steamed. Stay away from items that are buttered, battered, fried, or served with cream sauce. Items labeled "crispy" are usually fried, unless stated otherwise.  Choose water, low-fat milk, unsweetened iced tea, or other drinks without added sugar. If you want an alcoholic beverage, choose a lower calorie option such as a glass of wine or light beer.  Ask for dressings, sauces, and syrups on the side. These are usually high in calories, so you should limit the amount you eat.  If you want a salad, choose a garden salad and ask for grilled meats. Avoid extra toppings like bacon, cheese, or fried items. Ask for the dressing on the side, or ask for olive oil and vinegar or lemon to use as dressing.  Estimate how many servings of a food you are given. For example, a serving of cooked rice is  cup or about the size of half a baseball. Knowing serving sizes will help you be aware of how much food you are eating at restaurants. The list below tells you how big or small some common portion sizes are based on everyday objects: ? 1 oz--4 stacked dice. ? 3 oz--1 deck of cards. ? 1 tsp--1 die. ? 1 Tbsp-- a ping-pong ball. ? 2 Tbsp--1 ping-pong ball. ?  cup-- baseball. ? 1 cup--1 baseball. Summary  Calorie counting means keeping track of how many calories you eat and drink each day. If you eat fewer calories than your body needs, you should lose weight.  A healthy amount of weight to lose per week is usually 1-2 lb (0.5-0.9 kg). This usually means reducing your daily calorie intake by 500-750 calories.  The number of calories in a food can be found on a Nutrition Facts label. If a food does not have a Nutrition  Facts label, try to look up the calories online or ask your dietitian for  help.  Use your calories on foods and drinks that will fill you up, and not on foods and drinks that will leave you hungry.  Use smaller plates, glasses, and bowls to prevent overeating. This information is not intended to replace advice given to you by your health care provider. Make sure you discuss any questions you have with your health care provider. Document Revised: 07/15/2018 Document Reviewed: 09/25/2016 Elsevier Patient Education  2020 Elsevier Inc.  

## 2020-01-20 LAB — CBC
Hematocrit: 40.8 % (ref 34.0–46.6)
Hemoglobin: 13.7 g/dL (ref 11.1–15.9)
MCH: 29.5 pg (ref 26.6–33.0)
MCHC: 33.6 g/dL (ref 31.5–35.7)
MCV: 88 fL (ref 79–97)
Platelets: 204 10*3/uL (ref 150–450)
RBC: 4.64 x10E6/uL (ref 3.77–5.28)
RDW: 13.5 % (ref 11.7–15.4)
WBC: 7.4 10*3/uL (ref 3.4–10.8)

## 2020-01-20 LAB — CMP14+EGFR
ALT: 10 IU/L (ref 0–32)
AST: 14 IU/L (ref 0–40)
Albumin/Globulin Ratio: 1.8 (ref 1.2–2.2)
Albumin: 4.3 g/dL (ref 3.8–4.9)
Alkaline Phosphatase: 96 IU/L (ref 39–117)
BUN/Creatinine Ratio: 15 (ref 9–23)
BUN: 11 mg/dL (ref 6–24)
Bilirubin Total: 0.4 mg/dL (ref 0.0–1.2)
CO2: 24 mmol/L (ref 20–29)
Calcium: 9.5 mg/dL (ref 8.7–10.2)
Chloride: 103 mmol/L (ref 96–106)
Creatinine, Ser: 0.71 mg/dL (ref 0.57–1.00)
GFR calc Af Amer: 112 mL/min/{1.73_m2} (ref >59)
GFR calc non Af Amer: 97 mL/min/{1.73_m2} (ref >59)
Globulin, Total: 2.4 g/dL (ref 1.5–4.5)
Glucose: 108 mg/dL — ABNORMAL HIGH (ref 65–99)
Potassium: 3.7 mmol/L (ref 3.5–5.2)
Sodium: 141 mmol/L (ref 134–144)
Total Protein: 6.7 g/dL (ref 6.0–8.5)

## 2020-01-20 LAB — LIPID PANEL
Chol/HDL Ratio: 2.6 ratio (ref 0.0–4.4)
Cholesterol, Total: 137 mg/dL (ref 100–199)
HDL: 53 mg/dL (ref >39)
LDL Chol Calc (NIH): 64 mg/dL (ref 0–99)
Triglycerides: 109 mg/dL (ref 0–149)
VLDL Cholesterol Cal: 20 mg/dL (ref 5–40)

## 2020-01-20 LAB — VITAMIN D 25 HYDROXY (VIT D DEFICIENCY, FRACTURES): Vit D, 25-Hydroxy: 20.2 ng/mL — ABNORMAL LOW (ref 30.0–100.0)

## 2020-01-20 LAB — TSH: TSH: 1.18 u[IU]/mL (ref 0.450–4.500)

## 2020-01-22 ENCOUNTER — Telehealth: Payer: Self-pay | Admitting: Family Medicine

## 2020-01-22 NOTE — Telephone Encounter (Signed)
Pt called again regarding medication would like nurse to give her a call. [phone]. Please advise.

## 2020-01-22 NOTE — Telephone Encounter (Signed)
Pt called and is requesting 10 MG. Pt feels states the 5MG  isn't enough. Pt likes medication just wants the 10MG . [phone]. Please advise. amLODipine (NORVASC) 5 MG tablet 

## 2020-01-22 NOTE — Telephone Encounter (Signed)
Pt does not feel the 5 mg of Amlodapine are helping and would like it increased to 10 mg.  Started this 01/19/2020 Please advise.

## 2020-01-23 NOTE — Telephone Encounter (Signed)
Pt called per pt says the 5MG  medication brings it down for 8 hours then BP will run 180/106. [phone] Please advise.

## 2020-01-23 NOTE — Telephone Encounter (Signed)
Too early to adjust medication. We should give it at least a full week. If BP still 140/90 or higher then increasing amlodipine to 10mg  daily is next step. thanks

## 2020-01-23 NOTE — Telephone Encounter (Signed)
PAT IS REQUESTING TO UP HER BP MEDS 5MG  DOES NOT CONTROL SHE WANTS TO UP IT TO 10 MG / PLEASE REACH OUT TO PATIENT / PT NEEDS YOUR OK

## 2020-01-29 ENCOUNTER — Ambulatory Visit: Payer: 59

## 2020-01-29 ENCOUNTER — Other Ambulatory Visit: Payer: Self-pay | Admitting: Family Medicine

## 2020-01-29 NOTE — Telephone Encounter (Signed)
Copied from CRM #[id number]. Topic: Quick Communication - Rx Refill/Question >> Jan 29, 2020 12:28 PM Dominique Burton, [place] A wrote: Medication: amLODipine (NORVASC) 10 MG tablet , Vitamin D  Has the patient contacted their pharmacy? {Yes (Agent: If no, request that the patient contact the pharmacy for the refill.) (Agent: If yes, when and what did the pharmacy advise?)Contact PCP  Preferred Pharmacy (with phone number or street name): [facility] [address], [place] - [address]  Phone:  [phone] Fax:  [phone]     Agent: Please be advised that RX refills may take up to 3 business days. We ask that you follow-up with your pharmacy.

## 2020-01-29 NOTE — Telephone Encounter (Signed)
Pt requesting prescription Vitamin D. Not on active med list. Last prescribed 10/26/18 and prescription was dc'd 10/10/19.

## 2020-02-05 MED ORDER — VITAMIN D (ERGOCALCIFEROL) 1.25 MG (50000 UNIT) PO CAPS: 50000.0000 [IU] | ORAL_CAPSULE | ORAL | 0 refills | Status: DC

## 2020-02-05 NOTE — Addendum Note (Signed)
Addended by: Myles Lipps on: 02/05/2020 11:01 AM   Modules accepted: Orders

## 2020-02-14 ENCOUNTER — Ambulatory Visit: Payer: 59 | Attending: Internal Medicine

## 2020-02-14 DIAGNOSIS — Z23 Encounter for immunization: Secondary | ICD-10-CM

## 2020-02-14 NOTE — Progress Notes (Signed)
   Covid-19 Vaccination Clinic  Name:  Dominique Burton    MRN: [id number] DOB: January 10, 1966  02/14/2020  Dominique Burton was observed post Covid-19 immunization for 15 minutes without incident. She was provided with Vaccine Information Sheet and instruction to access the V-Safe system.   Dominique Burton was instructed to call 911 with any severe reactions post vaccine: Marland Kitchen Difficulty breathing  . Swelling of face and throat  . A fast heartbeat  . A bad rash all over body  . Dizziness and weakness   Immunizations Administered    Name Date Dose VIS Date Route   Pfizer COVID-19 Vaccine 02/14/2020  8:46 AM 0.3 mL 10/20/2019 Intramuscular   Manufacturer: [employer], [employer]   Lot: [id number]   NDC: [address]-4

## 2020-03-04 ENCOUNTER — Other Ambulatory Visit: Payer: Self-pay

## 2020-03-04 ENCOUNTER — Encounter: Payer: Self-pay | Admitting: Family Medicine

## 2020-03-04 ENCOUNTER — Telehealth (INDEPENDENT_AMBULATORY_CARE_PROVIDER_SITE_OTHER): Payer: 59 | Admitting: Family Medicine

## 2020-03-04 VITALS — BP 120/80

## 2020-03-04 DIAGNOSIS — I10 Essential (primary) hypertension: Secondary | ICD-10-CM | POA: Diagnosis not present

## 2020-03-04 DIAGNOSIS — E876 Hypokalemia: Secondary | ICD-10-CM | POA: Diagnosis not present

## 2020-03-04 MED ORDER — TRIAMTERENE-HCTZ 37.5-25 MG PO TABS: 1.0000 | ORAL_TABLET | Freq: Every day | ORAL | 3 refills | Status: DC

## 2020-03-04 MED ORDER — LISINOPRIL 20 MG PO TABS: 20.0000 mg | ORAL_TABLET | Freq: Every day | ORAL | 3 refills | Status: DC

## 2020-03-04 NOTE — Progress Notes (Signed)
Virtual Visit Note  I connected with patient on 03/04/20 at 553pm by video thru [employer] and verified that I am speaking with the correct person using two identifiers. Dominique Burton is currently located at home and patient is currently with them during visit. The provider, Rutherford Guys, MD is located in their office at time of visit.  I discussed the limitations, risks, security and privacy concerns of performing an evaluation and management service by telephone and the availability of in person appointments. I also discussed with the patient that there may be a patient responsible charge related to this service. The patient expressed understanding and agreed to proceed.   I provided 16 minutes of non-face-to-face time during this encounter.  CC: BP  HPI ? PMH: HTN, hypokalemia, vitamin D deficiency, h/o iron def anemia, GERD  Last OV march 2021 - dc HCTZ 2/2 low K, start amlodipine, cont lisinopril  She is tolerating well but BP not well controlled  170/106 Started retaining fluid - gained 5 lbs She restarted her lisinopril-HCTZ, she is taking KCL 91meq daily, her bp is back to normal  BP Readings from Last 3 Encounters:  03/04/20 120/80  01/19/20 (!) 158/98  08/01/19 138/82    Lab Results  Component Value Date   CREATININE 0.71 01/19/2020   BUN 11 01/19/2020   NA 141 01/19/2020   K 3.7 01/19/2020   CL 103 01/19/2020   CO2 24 01/19/2020    No Known Allergies  Prior to Admission medications   Medication Sig Start Date End Date Taking? Authorizing Provider  cetirizine (ZYRTEC) 10 MG tablet Take 1 tablet (10 mg total) by mouth daily. 01/28/19  Yes Rutherford Guys, MD  escitalopram (LEXAPRO) 5 MG tablet Take 1 tablet (5 mg total) by mouth at bedtime. 01/19/20  Yes Rutherford Guys, MD  famotidine (PEPCID) 20 MG tablet Take 1 tablet (20 mg total) by mouth 2 (two) times daily. 07/07/19  Yes Rutherford Guys, MD  fluticasone (FLONASE) 50 MCG/ACT nasal spray Place 1 spray  into both nostrils 2 (two) times daily. 01/28/19  Yes Rutherford Guys, MD  lisinopril-hydrochlorothiazide (ZESTORETIC) 20-25 MG tablet Take 1 tablet by mouth daily. 10/10/19  Yes Rutherford Guys, MD  potassium chloride SA (KLOR-CON) 20 MEQ tablet Take 1 tablet (20 mEq total) by mouth daily. 01/19/20  Yes Rutherford Guys, MD  Vitamin D, Ergocalciferol, (DRISDOL) 1.25 MG (50000 UNIT) CAPS capsule Take 1 capsule (50,000 Units total) by mouth every 7 (seven) days. 02/05/20   Rutherford Guys, MD    Past Medical History:  Diagnosis Date  . Hypertension   . Vitamin D deficiency     Past Surgical History:  Procedure Laterality Date  . ABDOMINAL HYSTERECTOMY     PARTIAL  . CESAREAN SECTION      Social History   Tobacco Use  . Smoking status: Never Smoker  . Smokeless tobacco: Never Used  Substance Use Topics  . Alcohol use: No    Family History  Problem Relation Age of Onset  . Hypertension Mother   . Diabetes Father   . Hypertension Father   . Hypertension Sister   . Heart disease Brother   . Hypertension Brother   . Hypertension Maternal Grandmother   . Hypertension Maternal Grandfather     ROS Per hpi  Objective  Vitals as reported by the patient: none  GEN: AAOx3, NAD HEENT: [place]/AT, pupils are symmetrical, EOMI, non-icteric sclera Resp: breathing comfortably, speaking in full  sentences Skin: no rashes noted, no pallor Psych: good eye contact, normal mood and affect   ASSESSMENT and PLAN  1. Essential hypertension 2. Hypokalemia Trial of maxzide to aide with hypokalemia - Basic Metabolic Panel; Future - lab next week  Other orders - lisinopril (ZESTRIL) 20 MG tablet; Take 1 tablet (20 mg total) by mouth daily. - triamterene-hydrochlorothiazide (MAXZIDE-25) 37.5-25 MG tablet; Take 1 tablet by mouth daily.  FOLLOW-UP: as scheduled   The above assessment and management plan was discussed with the patient. The patient verbalized understanding of and has agreed  to the management plan. Patient is aware to call the clinic if symptoms persist or worsen. Patient is aware when to return to the clinic for a follow-up visit. Patient educated on when it is appropriate to go to the emergency department.     Myles Lipps, MD [facility] [address] [place], [place] [address] Ph.  [phone] Fax [phone]

## 2020-03-04 NOTE — Patient Instructions (Signed)
° ° ° °  If you have lab work done today you will be contacted with your lab results within the next 2 weeks.  If you have not heard from us then please contact us. The fastest way to get your results is to register for My Chart. ° ° °IF you received an x-ray today, you will receive an invoice from [facility]. Please contact  Radiology at [phone] with questions or concerns regarding your invoice.  ° °IF you received labwork today, you will receive an invoice from [facility]. Please contact [facility] at [phone] with questions or concerns regarding your invoice.  ° °Our billing staff will not be able to assist you with questions regarding bills from these companies. ° °You will be contacted with the lab results as soon as they are available. The fastest way to get your results is to activate your My Chart account. Instructions are located on the last page of this paperwork. If you have not heard from us regarding the results in 2 weeks, please contact this office. °  ° ° ° °

## 2020-03-14 ENCOUNTER — Ambulatory Visit (INDEPENDENT_AMBULATORY_CARE_PROVIDER_SITE_OTHER): Payer: 59 | Admitting: Family Medicine

## 2020-03-14 ENCOUNTER — Other Ambulatory Visit: Payer: Self-pay

## 2020-03-14 DIAGNOSIS — I10 Essential (primary) hypertension: Secondary | ICD-10-CM

## 2020-03-14 NOTE — Progress Notes (Signed)
Lab visit only. 

## 2020-03-15 LAB — LIPID PANEL
Chol/HDL Ratio: 2.8 ratio (ref 0.0–4.4)
Cholesterol, Total: 168 mg/dL (ref 100–199)
HDL: 61 mg/dL (ref >39)
LDL Chol Calc (NIH): 90 mg/dL (ref 0–99)
Triglycerides: 94 mg/dL (ref 0–149)
VLDL Cholesterol Cal: 17 mg/dL (ref 5–40)

## 2020-03-15 LAB — BASIC METABOLIC PANEL
BUN/Creatinine Ratio: 12 (ref 9–23)
BUN: 10 mg/dL (ref 6–24)
CO2: 26 mmol/L (ref 20–29)
Calcium: 9.4 mg/dL (ref 8.7–10.2)
Chloride: 102 mmol/L (ref 96–106)
Creatinine, Ser: 0.83 mg/dL (ref 0.57–1.00)
GFR calc Af Amer: 92 mL/min/{1.73_m2} (ref >59)
GFR calc non Af Amer: 80 mL/min/{1.73_m2} (ref >59)
Glucose: 107 mg/dL — ABNORMAL HIGH (ref 65–99)
Potassium: 3.9 mmol/L (ref 3.5–5.2)
Sodium: 140 mmol/L (ref 134–144)

## 2020-03-19 ENCOUNTER — Encounter: Payer: Self-pay | Admitting: Family Medicine

## 2020-03-19 DIAGNOSIS — I10 Essential (primary) hypertension: Secondary | ICD-10-CM

## 2020-03-19 MED ORDER — LISINOPRIL-HYDROCHLOROTHIAZIDE 20-25 MG PO TABS: 1.0000 | ORAL_TABLET | Freq: Every day | ORAL | 3 refills | Status: DC

## 2020-03-19 MED ORDER — POTASSIUM CHLORIDE CRYS ER 20 MEQ PO TBCR: 20.0000 meq | EXTENDED_RELEASE_TABLET | Freq: Every day | ORAL | 3 refills | Status: DC

## 2020-04-26 ENCOUNTER — Other Ambulatory Visit: Payer: Self-pay | Admitting: Family Medicine

## 2020-04-26 NOTE — Telephone Encounter (Signed)
Requested medication (s) are due for refill today: yes  Requested medication (s) are on the active medication list: yes  Last refill:  02/05/20 #12 0 refills  Future visit scheduled: no  Notes to clinic:  not delegated per protocol      Requested Prescriptions  Pending Prescriptions Disp Refills   Vitamin D, Ergocalciferol, (DRISDOL) 1.25 MG (50000 UNIT) CAPS capsule [Pharmacy Med Name: Vitamin D (Ergocalciferol) 1.25 MG (50000 UT) Oral Capsule] 12 capsule 0    Sig: TAKE 1 CAPSULE BY MOUTH EVERY 7 DAYS      Endocrinology:  Vitamins - Vitamin D Supplementation Failed - 04/26/2020  2:56 PM      Failed - 50,000 IU strengths are not delegated      Failed - Phosphate in normal range and within 360 days    No results found for: PHOS        Failed - Vitamin D in normal range and within 360 days    Vit D, 25-Hydroxy  Date Value Ref Range Status  01/19/2020 20.2 (L) 30.0 - 100.0 ng/mL Final    Comment:    Vitamin D deficiency has been defined by the Institute of Medicine and an Endocrine Society practice guideline as a level of serum 25-OH vitamin D less than 20 ng/mL (1,2). The Endocrine Society went on to further define vitamin D insufficiency as a level between 21 and 29 ng/mL (2). 1. IOM (Institute of Medicine). 2010. Dietary reference    intakes for calcium and D. [place]: The    [employer]. 2. Holick MF, Binkley [place], Bischoff-Ferrari HA, et al.    Evaluation, treatment, and prevention of vitamin D    deficiency: an Endocrine Society clinical practice    guideline. JCEM. 2011 Jul; 96(7):1911-30.           Passed - Ca in normal range and within 360 days    Calcium  Date Value Ref Range Status  03/14/2020 9.4 8.7 - 10.2 mg/dL Final          Passed - Valid encounter within last 12 months    Recent Outpatient Visits           1 month ago Essential hypertension   [facility] Meda Coffee, MD   1 month ago Essential hypertension    [facility], Meda Coffee, MD   3 months ago Essential hypertension   [facility], Meda Coffee, MD   6 months ago Essential hypertension   [facility], Meda Coffee, MD   7 months ago Screening-pulmonary TB   [facility], Meda Coffee, MD               Signed Prescriptions Disp Refills   escitalopram (LEXAPRO) 5 MG tablet 30 tablet 2    Sig: TAKE 1 TABLET BY MOUTH AT BEDTIME      Psychiatry:  Antidepressants - SSRI Passed - 04/26/2020  2:56 PM      Passed - Valid encounter within last 6 months    Recent Outpatient Visits           1 month ago Essential hypertension   [facility], Meda Coffee, MD   1 month ago Essential hypertension   [facility], Meda Coffee, MD   3 months ago Essential hypertension   [facility], Meda Coffee, MD   6 months ago Essential hypertension  [facility], Lilia Argue, MD   7 months ago Screening-pulmonary TB   [facility], Lilia Argue, MD

## 2020-04-26 NOTE — Telephone Encounter (Signed)
Requested Prescriptions  Pending Prescriptions Disp Refills   Vitamin D, Ergocalciferol, (DRISDOL) 1.25 MG (50000 UNIT) CAPS capsule [Pharmacy Med Name: Vitamin D (Ergocalciferol) 1.25 MG (50000 UT) Oral Capsule] 12 capsule 0    Sig: TAKE 1 CAPSULE BY MOUTH EVERY 7 DAYS     Endocrinology:  Vitamins - Vitamin D Supplementation Failed - 04/26/2020  2:56 PM      Failed - 50,000 IU strengths are not delegated      Failed - Phosphate in normal range and within 360 days    No results found for: PHOS       Failed - Vitamin D in normal range and within 360 days    Vit D, 25-Hydroxy  Date Value Ref Range Status  01/19/2020 20.2 (L) 30.0 - 100.0 ng/mL Final    Comment:    Vitamin D deficiency has been defined by the Institute of Medicine and an Endocrine Society practice guideline as a level of serum 25-OH vitamin D less than 20 ng/mL (1,2). The Endocrine Society went on to further define vitamin D insufficiency as a level between 21 and 29 ng/mL (2). 1. IOM (Institute of Medicine). 2010. Dietary reference    intakes for calcium and D. [place]: The    [employer]. 2. Holick MF, Binkley [place], Bischoff-Ferrari HA, et al.    Evaluation, treatment, and prevention of vitamin D    deficiency: an Endocrine Society clinical practice    guideline. JCEM. 2011 Jul; 96(7):1911-30.          Passed - Ca in normal range and within 360 days    Calcium  Date Value Ref Range Status  03/14/2020 9.4 8.7 - 10.2 mg/dL Final         Passed - Valid encounter within last 12 months    Recent Outpatient Visits          1 month ago Essential hypertension   [facility], Meda Coffee, MD   1 month ago Essential hypertension   [facility], Meda Coffee, MD   3 months ago Essential hypertension   [facility], Meda Coffee, MD   6 months ago Essential hypertension   [facility], Meda Coffee, MD   7 months ago Screening-pulmonary TB    [facility], Meda Coffee, MD              escitalopram (LEXAPRO) 5 MG tablet [Pharmacy Med Name: Escitalopram Oxalate 5 MG Oral Tablet] 30 tablet 2    Sig: TAKE 1 TABLET BY MOUTH AT BEDTIME     Psychiatry:  Antidepressants - SSRI Passed - 04/26/2020  2:56 PM      Passed - Valid encounter within last 6 months    Recent Outpatient Visits          1 month ago Essential hypertension   [facility], Meda Coffee, MD   1 month ago Essential hypertension   [facility], Meda Coffee, MD   3 months ago Essential hypertension   [facility] Meda Coffee, MD   6 months ago Essential hypertension   [facility] Meda Coffee, MD   7 months ago Screening-pulmonary TB   [facility], Meda Coffee, MD

## 2020-04-29 ENCOUNTER — Encounter: Payer: Self-pay | Admitting: Emergency Medicine

## 2020-04-29 ENCOUNTER — Ambulatory Visit (INDEPENDENT_AMBULATORY_CARE_PROVIDER_SITE_OTHER): Payer: 59

## 2020-04-29 ENCOUNTER — Other Ambulatory Visit: Payer: Self-pay

## 2020-04-29 ENCOUNTER — Ambulatory Visit (INDEPENDENT_AMBULATORY_CARE_PROVIDER_SITE_OTHER): Payer: 59 | Admitting: Emergency Medicine

## 2020-04-29 VITALS — BP 128/84 | HR 72 | Temp 97.7°F | Resp 18 | Ht 66.0 in | Wt 247.2 lb

## 2020-04-29 DIAGNOSIS — M25562 Pain in left knee: Secondary | ICD-10-CM | POA: Diagnosis not present

## 2020-04-29 DIAGNOSIS — E559 Vitamin D deficiency, unspecified: Secondary | ICD-10-CM

## 2020-04-29 NOTE — Patient Instructions (Addendum)
   If you have lab work done today you will be contacted with your lab results within the next 2 weeks.  If you have not heard from us then please contact us. The fastest way to get your results is to register for My Chart.   IF you received an x-ray today, you will receive an invoice from [facility]. Please contact [facility] at [phone] with questions or concerns regarding your invoice.   IF you received labwork today, you will receive an invoice from [facility]. Please contact [facility] at [phone] with questions or concerns regarding your invoice.   Our billing staff will not be able to assist you with questions regarding bills from these companies.  You will be contacted with the lab results as soon as they are available. The fastest way to get your results is to activate your My Chart account. Instructions are located on the last page of this paperwork. If you have not heard from us regarding the results in 2 weeks, please contact this office.      Acute Knee Pain, Adult Many things can cause knee pain. Sometimes, knee pain is sudden (acute) and may be caused by damage, swelling, or irritation of the muscles and tissues that support your knee. The pain often goes away on its own with time and rest. If the pain does not go away, tests may be done to find out what is causing the pain. Follow these instructions at home: Pay attention to any changes in your symptoms. Take these actions to relieve your pain. If you have a knee sleeve or brace:   Wear the sleeve or brace as told by your doctor. Remove it only as told by your doctor.  Loosen the sleeve or brace if your toes: ? Tingle. ? Become numb. ? Turn cold and blue.  Keep the sleeve or brace clean.  If the sleeve or brace is not waterproof: ? Do not let it get wet. ? Cover it with a watertight covering when you take a bath or shower. Activity  Rest your knee.  Do not do things that cause  pain.  Avoid activities where both feet leave the ground at the same time (high-impact activities). Examples are running, jumping rope, and doing jumping jacks.  Work with a physical therapist to make a safe exercise program, as told by your doctor. Managing pain, stiffness, and swelling   If told, put ice on the knee: ? Put ice in a plastic bag. ? Place a towel between your skin and the bag. ? Leave the ice on for 20 minutes, 2-3 times a day.  If told, put pressure (compression) on your injured knee to control swelling, give support, and help with discomfort. Compression may be done with an elastic bandage. General instructions  Take all medicines only as told by your doctor.  Raise (elevate) your knee while you are sitting or lying down. Make sure your knee is higher than your heart.  Sleep with a pillow under your knee.  Do not use any products that contain nicotine or tobacco. These include cigarettes, e-cigarettes, and chewing tobacco. These products may slow down healing. If you need help quitting, ask your doctor.  If you are overweight, work with your doctor and a food expert (dietitian) to set goals to lose weight. Being overweight can make your knee hurt more.  Keep all follow-up visits as told by your doctor. This is important. Contact a doctor if:  The knee pain does   not stop.  The knee pain changes or gets worse.  You have a fever along with knee pain.  Your knee feels warm when you touch it.  Your knee gives out or locks up. Get help right away if:  Your knee swells, and the swelling gets worse.  You cannot move your knee.  You have very bad knee pain. Summary  Many things can cause knee pain. The pain often goes away on its own with time and rest.  Your doctor may do tests to find out the cause of the pain.  Pay attention to any changes in your symptoms. Relieve your pain with rest, medicines, light activity, and use of ice.  Get help right away if  you cannot move your knee or your knee pain is very bad. This information is not intended to replace advice given to you by your health care provider. Make sure you discuss any questions you have with your health care provider. Document Revised: 04/07/2018 Document Reviewed: 04/07/2018 Elsevier Patient Education  2020 [employer]  

## 2020-04-29 NOTE — Progress Notes (Signed)
Dominique Burton 54 y.o.   Chief Complaint  Patient presents with  . Knee Pain    patient states she has been having some knee pain since 04/14/2020 and wants to get the knee checked out because the pain has not gone  away. Per patient she has been using ice , heat and aleve and it has been helping some.    HISTORY OF PRESENT ILLNESS: This is a 55 y.o. female complaining of sharp steady pain to her left knee for the past couple weeks. No other associated symptoms. Denies injury. Aleve helps with some of the pain.  HPI   Prior to Admission medications   Medication Sig Start Date End Date Taking? Authorizing Provider  cetirizine (ZYRTEC) 10 MG tablet Take 1 tablet (10 mg total) by mouth daily. 01/28/19  Yes Myles Lipps, MD  escitalopram (LEXAPRO) 5 MG tablet TAKE 1 TABLET BY MOUTH AT BEDTIME 04/26/20  Yes Myles Lipps, MD  famotidine (PEPCID) 20 MG tablet Take 1 tablet (20 mg total) by mouth 2 (two) times daily. 07/07/19  Yes Myles Lipps, MD  lisinopril-hydrochlorothiazide (ZESTORETIC) 20-25 MG tablet Take 1 tablet by mouth daily. 03/19/20  Yes Myles Lipps, MD  potassium chloride SA (KLOR-CON) 20 MEQ tablet Take 1 tablet (20 mEq total) by mouth daily. 03/19/20  Yes Myles Lipps, MD  Vitamin D, Ergocalciferol, (DRISDOL) 1.25 MG (50000 UNIT) CAPS capsule TAKE 1 CAPSULE BY MOUTH EVERY 7 DAYS 04/26/20  Yes Myles Lipps, MD  fluticasone [facility]) 50 MCG/ACT nasal spray Place 1 spray into both nostrils 2 (two) times daily. Patient not taking: Reported on 04/29/2020 01/28/19   Myles Lipps, MD    No Known Allergies  Patient Active Problem List   Diagnosis Date Noted  . Essential hypertension 01/11/2018  . Vitamin D deficiency 01/11/2018  . Hypokalemia 01/11/2018    Past Medical History:  Diagnosis Date  . Hypertension   . Vitamin D deficiency     Past Surgical History:  Procedure Laterality Date  . ABDOMINAL HYSTERECTOMY     PARTIAL  . CESAREAN SECTION       Social History   Socioeconomic History  . Marital status: Married    Spouse name: Not on file  . Number of children: Not on file  . Years of education: Not on file  . Highest education level: Not on file  Occupational History  . Not on file  Tobacco Use  . Smoking status: Never Smoker  . Smokeless tobacco: Never Used  Substance and Sexual Activity  . Alcohol use: No  . Drug use: No  . Sexual activity: Yes  Other Topics Concern  . Not on file  Social History Narrative  . Not on file   Social Determinants of Health   Financial Resource Strain:   . Difficulty of Paying Living Expenses:   Food Insecurity:   . Worried About Programme researcher, broadcasting/film/video in the Last Year:   . Barista in the Last Year:   Transportation Needs:   . Freight forwarder (Medical):   Marland Kitchen Lack of Transportation (Non-Medical):   Physical Activity:   . Days of Exercise per Week:   . Minutes of Exercise per Session:   Stress:   . Feeling of Stress :   Social Connections:   . Frequency of Communication with Friends and Family:   . Frequency of Social Gatherings with Friends and Family:   . Attends Religious Services:   .  Active Member of Clubs or Organizations:   . Attends Banker Meetings:   Marland Kitchen Marital Status:   Intimate Partner Violence:   . Fear of Current or Ex-Partner:   . Emotionally Abused:   Marland Kitchen Physically Abused:   . Sexually Abused:     Family History  Problem Relation Age of Onset  . Hypertension Mother   . Diabetes Father   . Hypertension Father   . Hypertension Sister   . Heart disease Brother   . Hypertension Brother   . Hypertension Maternal Grandmother   . Hypertension Maternal Grandfather      Review of Systems  Constitutional: Negative.  Negative for chills and fever.  HENT: Negative.  Negative for congestion and sore throat.   Respiratory: Negative.  Negative for cough and shortness of breath.   Cardiovascular: Negative.  Negative for chest pain and  palpitations.  Gastrointestinal: Negative.  Negative for abdominal pain, diarrhea, nausea and vomiting.  Genitourinary: Negative.  Negative for dysuria and hematuria.  Musculoskeletal: Positive for joint pain (Left knee).  Skin: Negative.  Negative for rash.  Neurological: Negative.  Negative for dizziness and headaches.  Endo/Heme/Allergies: Negative.   All other systems reviewed and are negative.  Today's Vitals   04/29/20 1552  BP: 128/84  Pulse: 72  Resp: 18  Temp: 97.7 F (36.5 C)  TempSrc: Temporal  SpO2: 98%  Weight: 247 lb 3.2 oz (112.1 kg)  Height: 5\' 6"  (1.676 m)   Body mass index is 39.9 kg/m.   Physical Exam Vitals reviewed.  Constitutional:      Appearance: Normal appearance.  HENT:     Head: Normocephalic.  Eyes:     Extraocular Movements: Extraocular movements intact.     Pupils: Pupils are equal, round, and reactive to light.  Cardiovascular:     Rate and Rhythm: Normal rate.  Pulmonary:     Effort: Pulmonary effort is normal.  Musculoskeletal:     Cervical back: Normal range of motion.     Comments: Left knee: No erythema or bruising. No swelling. Stable in flexion and extension. No localized tenderness with full range of motion.  Skin:    General: Skin is warm and dry.  Neurological:     General: No focal deficit present.     Mental Status: She is alert and oriented to person, place, and time.  Psychiatric:        Mood and Affect: Mood normal.        Behavior: Behavior normal.      ASSESSMENT & PLAN: Dominique Burton was seen today for knee pain.  Diagnoses and all orders for this visit:  Acute pain of left knee -     DG Knee Complete 4 Views Left    Patient Instructions       If you have lab work done today you will be contacted with your lab results within the next 2 weeks.  If you have not heard from Dominique Burton then please contact us. The fastest way to get your results is to register for My Chart.   IF you received an x-ray today, you will  receive an invoice from [facility]. Please contact [facility] at [phone] with questions or concerns regarding your invoice.   IF you received labwork today, you will receive an invoice from [place]. Please contact [facility] at [phone] with questions or concerns regarding your invoice.   Our billing staff will not be able to assist you with questions regarding bills from these companies.  You will be contacted with the lab results as soon as they are available. The fastest way to get your results is to activate your My Chart account. Instructions are located on the last page of this paperwork. If you have not heard from [place] regarding the results in 2 weeks, please contact this office.      Acute Knee Pain, Adult Many things can cause knee pain. Sometimes, knee pain is sudden (acute) and may be caused by damage, swelling, or irritation of the muscles and tissues that support your knee. The pain often goes away on its own with time and rest. If the pain does not go away, tests may be done to find out what is causing the pain. Follow these instructions at home: Pay attention to any changes in your symptoms. Take these actions to relieve your pain. If you have a knee sleeve or brace:   Wear the sleeve or brace as told by your doctor. Remove it only as told by your doctor.  Loosen the sleeve or brace if your toes: ? Tingle. ? Become numb. ? Turn cold and blue.  Keep the sleeve or brace clean.  If the sleeve or brace is not waterproof: ? Do not let it get wet. ? Cover it with a watertight covering when you take a bath or shower. Activity  Rest your knee.  Do not do things that cause pain.  Avoid activities where both feet leave the ground at the same time (high-impact activities). Examples are running, jumping rope, and doing jumping jacks.  Work with a physical therapist to make a safe exercise program, as told by your doctor. Managing pain, stiffness,  and swelling   If told, put ice on the knee: ? Put ice in a plastic bag. ? Place a towel between your skin and the bag. ? Leave the ice on for 20 minutes, 2-3 times a day.  If told, put pressure (compression) on your injured knee to control swelling, give support, and help with discomfort. Compression may be done with an elastic bandage. General instructions  Take all medicines only as told by your doctor.  Raise (elevate) your knee while you are sitting or lying down. Make sure your knee is higher than your heart.  Sleep with a pillow under your knee.  Do not use any products that contain nicotine or tobacco. These include cigarettes, e-cigarettes, and chewing tobacco. These products may slow down healing. If you need help quitting, ask your doctor.  If you are overweight, work with your doctor and a food expert (dietitian) to set goals to lose weight. Being overweight can make your knee hurt more.  Keep all follow-up visits as told by your doctor. This is important. Contact a doctor if:  The knee pain does not stop.  The knee pain changes or gets worse.  You have a fever along with knee pain.  Your knee feels warm when you touch it.  Your knee gives out or locks up. Get help right away if:  Your knee swells, and the swelling gets worse.  You cannot move your knee.  You have very bad knee pain. Summary  Many things can cause knee pain. The pain often goes away on its own with time and rest.  Your doctor may do tests to find out the cause of the pain.  Pay attention to any changes in your symptoms. Relieve your pain with rest, medicines, light activity, and use of ice.  Get help right away  if you cannot move your knee or your knee pain is very bad. This information is not intended to replace advice given to you by your health care provider. Make sure you discuss any questions you have with your health care provider. Document Revised: 04/07/2018 Document Reviewed:  04/07/2018 Elsevier Patient Education  2020 Elsevier Inc.      Agustina Caroli, MD [facility]

## 2020-05-01 ENCOUNTER — Other Ambulatory Visit: Payer: Self-pay

## 2020-05-01 ENCOUNTER — Telehealth: Payer: Self-pay | Admitting: Family Medicine

## 2020-05-01 DIAGNOSIS — M25562 Pain in left knee: Secondary | ICD-10-CM

## 2020-05-01 NOTE — Telephone Encounter (Signed)
Pt would like a referral to orthro.  Please Advise

## 2020-05-01 NOTE — Telephone Encounter (Signed)
Pt called states Dr Alvy Bimler said she need a knee brace but she did get one, she want to know if is ok  For her to  come back and get one

## 2020-05-01 NOTE — Telephone Encounter (Signed)
Please Advise

## 2020-05-01 NOTE — Progress Notes (Unsigned)
ref

## 2020-05-01 NOTE — Telephone Encounter (Signed)
Refer to Ortho please.  Thanks.

## 2020-05-01 NOTE — Telephone Encounter (Signed)
Referral placed.

## 2020-05-01 NOTE — Telephone Encounter (Signed)
The type of knee brace she needs she can get it at places like [facility], [facility], [facility].  She does not need the bulky ones that we have here.  Thanks.

## 2020-05-06 ENCOUNTER — Encounter: Payer: Self-pay | Admitting: Family Medicine

## 2020-05-06 ENCOUNTER — Other Ambulatory Visit: Payer: Self-pay

## 2020-05-06 ENCOUNTER — Ambulatory Visit (INDEPENDENT_AMBULATORY_CARE_PROVIDER_SITE_OTHER): Payer: 59 | Admitting: Family Medicine

## 2020-05-06 DIAGNOSIS — M25562 Pain in left knee: Secondary | ICD-10-CM | POA: Diagnosis not present

## 2020-05-06 NOTE — Progress Notes (Signed)
   Office Visit Note   Patient: Dominique Burton           Date of Birth: 1966/08/09           MRN: [id number] Visit Date: 05/06/2020 Requested by: Myles Lipps, MD [address] [place],  [place] [address] PCP: Myles Lipps, MD  Subjective: Chief Complaint  Patient presents with  . Left Knee - Pain    Pain anterior and medial aspects of knee since 04/13/20. Was wearing heels and going up stairs [place] her [place]. Pain started after that. Went to urgent care & had xrays. Ice & heat (heat helps at night).    HPI: She is here with left knee pain.  Symptoms started about 3 months ago.  She was at her daughter's graduation walking up steps in the [facility] and started noticing pain mostly on the medial aspect of her knee.  It has continued to bother her although has gotten slightly better.  Occasionally it feels like something needs to shift in her knee.  It feels stiff and achy.  She prefers not to take medication but she has tried some ice and heat.  No previous problems with her knee.  She borrowed her husband's a knee brace but it does not seem to make much difference.               ROS:   All other systems were reviewed and are negative.  Objective: Vital Signs: There were no vitals taken for this visit.  Physical Exam:  General:  Alert and oriented, in no acute distress. Pulm:  Breathing unlabored. Psy:  Normal mood, congruent affect. Skin: No bruising Left knee: 1+ effusion with no warmth.  2+ patellofemoral crepitus, no pain with patella compression.  She is tender on the medial joint of joint line, no click with McMurray's but it does cause pain.  No laxity with varus or valgus stress.  Imaging: No results found.  Assessment & Plan: 1.  Left knee pain with patellofemoral DJD, question degenerative medial meniscus tear -She very much wants to avoid surgery or aggressive treatments.  We will try a hinged knee brace for support.  If symptoms persist,  could try a one-time cortisone injection prior to considering MRI scan.     Procedures: No procedures performed  No notes on file     PMFS History: Patient Active Problem List   Diagnosis Date Noted  . Essential hypertension 01/11/2018  . Vitamin D deficiency 01/11/2018  . Hypokalemia 01/11/2018   Past Medical History:  Diagnosis Date  . Hypertension   . Vitamin D deficiency     Family History  Problem Relation Age of Onset  . Hypertension Mother   . Diabetes Father   . Hypertension Father   . Hypertension Sister   . Heart disease Brother   . Hypertension Brother   . Hypertension Maternal Grandmother   . Hypertension Maternal Grandfather     Past Surgical History:  Procedure Laterality Date  . ABDOMINAL HYSTERECTOMY     PARTIAL  . CESAREAN SECTION     Social History   Occupational History  . Not on file  Tobacco Use  . Smoking status: Never Smoker  . Smokeless tobacco: Never Used  Substance and Sexual Activity  . Alcohol use: No  . Drug use: No  . Sexual activity: Yes

## 2020-06-18 ENCOUNTER — Telehealth: Payer: Self-pay | Admitting: Family Medicine

## 2020-06-18 MED ORDER — NABUMETONE 500 MG PO TABS: 250.0000 mg | ORAL_TABLET | Freq: Two times a day (BID) | ORAL | 3 refills | Status: DC | PRN

## 2020-06-18 NOTE — Addendum Note (Signed)
Addended by: Lillia Carmel on: 06/18/2020 05:21 PM   Modules accepted: Orders

## 2020-06-18 NOTE — Telephone Encounter (Signed)
The patient has been wearing her hinged knee brace since her visit here in June. The pain went away until the last few days. The pain has returned, along with swelling. She tried ibuprofen and aleve - not helping. A friend of hers has suggested trying nabumetone, as that helps her. The patient would like to try this, but does not want the strongest dose, though -she does not like taking medications.

## 2020-06-18 NOTE — Telephone Encounter (Signed)
Patient called.   She is requesting a call back from [place]. Did not disclose why  Call back: [phone]

## 2020-06-18 NOTE — Telephone Encounter (Signed)
Rx sent 

## 2020-06-18 NOTE — Telephone Encounter (Signed)
Left a voice mail to call back or send a message through MyChart.

## 2020-07-24 ENCOUNTER — Other Ambulatory Visit: Payer: Self-pay | Admitting: Family Medicine

## 2020-07-24 DIAGNOSIS — I10 Essential (primary) hypertension: Secondary | ICD-10-CM

## 2020-08-12 ENCOUNTER — Ambulatory Visit (INDEPENDENT_AMBULATORY_CARE_PROVIDER_SITE_OTHER): Payer: 59 | Admitting: Family Medicine

## 2020-08-12 ENCOUNTER — Encounter: Payer: Self-pay | Admitting: Family Medicine

## 2020-08-12 ENCOUNTER — Other Ambulatory Visit: Payer: Self-pay | Admitting: Family Medicine

## 2020-08-12 ENCOUNTER — Other Ambulatory Visit: Payer: Self-pay

## 2020-08-12 VITALS — BP 140/90 | HR 73 | Temp 97.9°F | Resp 18 | Ht 66.0 in | Wt 247.6 lb

## 2020-08-12 DIAGNOSIS — E559 Vitamin D deficiency, unspecified: Secondary | ICD-10-CM | POA: Diagnosis not present

## 2020-08-12 DIAGNOSIS — I10 Essential (primary) hypertension: Secondary | ICD-10-CM

## 2020-08-12 DIAGNOSIS — Z6839 Body mass index (BMI) 39.0-39.9, adult: Secondary | ICD-10-CM

## 2020-08-12 MED ORDER — LISINOPRIL-HYDROCHLOROTHIAZIDE 20-25 MG PO TABS: 1.0000 | ORAL_TABLET | Freq: Every day | ORAL | 1 refills | Status: DC

## 2020-08-12 MED ORDER — POTASSIUM CHLORIDE CRYS ER 20 MEQ PO TBCR: 20.0000 meq | EXTENDED_RELEASE_TABLET | Freq: Every day | ORAL | 1 refills | Status: DC

## 2020-08-12 MED ORDER — ESCITALOPRAM OXALATE 5 MG PO TABS: 5.0000 mg | ORAL_TABLET | Freq: Every day | ORAL | 1 refills | Status: DC

## 2020-08-12 MED ORDER — FAMOTIDINE 20 MG PO TABS: 20.0000 mg | ORAL_TABLET | Freq: Two times a day (BID) | ORAL | 0 refills | Status: DC

## 2020-08-12 NOTE — Telephone Encounter (Signed)
Requested medication (s) are due for refill today: yes  Requested medication (s) are on the active medication list: yes  Last refill:  04/26/20  Future visit scheduled: no was seen today in office  Notes to clinic:  med not delegated to NT to RF   Requested Prescriptions  Pending Prescriptions Disp Refills   Vitamin D, Ergocalciferol, (DRISDOL) 1.25 MG (50000 UNIT) CAPS capsule [Pharmacy Med Name: VITAMIN D2 (ERGO) 1.25MG   CAP] 12 capsule 0    Sig: Take 1 capsule by mouth once a week      Endocrinology:  Vitamins - Vitamin D Supplementation Failed - 08/12/2020  2:42 PM      Failed - 50,000 IU strengths are not delegated      Failed - Phosphate in normal range and within 360 days    No results found for: PHOS        Failed - Vitamin D in normal range and within 360 days    Vit D, 25-Hydroxy  Date Value Ref Range Status  01/19/2020 20.2 (L) 30.0 - 100.0 ng/mL Final    Comment:    Vitamin D deficiency has been defined by the Institute of Medicine and an Endocrine Society practice guideline as a level of serum 25-OH vitamin D less than 20 ng/mL (1,2). The Endocrine Society went on to further define vitamin D insufficiency as a level between 21 and 29 ng/mL (2). 1. IOM (Institute of Medicine). 2010. Dietary reference    intakes for calcium and D. [place]: The    [employer]. 2. Holick MF, Binkley , Bischoff-Ferrari HA, et al.    Evaluation, treatment, and prevention of vitamin D    deficiency: an Endocrine Society clinical practice    guideline. JCEM. 2011 Jul; 96(7):1911-30.           Passed - Ca in normal range and within 360 days    Calcium  Date Value Ref Range Status  03/14/2020 9.4 8.7 - 10.2 mg/dL Final          Passed - Valid encounter within last 12 months    Recent Outpatient Visits           Today Essential hypertension   [facility], Meda Coffee, MD   3 months ago Acute pain of left knee   [facility], Eilleen Kempf, MD   5 months ago Essential hypertension   [facility], Meda Coffee, MD   5 months ago Essential hypertension   [facility], Meda Coffee, MD   6 months ago Essential hypertension   [facility], Meda Coffee, MD

## 2020-08-12 NOTE — Patient Instructions (Signed)
° ° ° °  If you have lab work done today you will be contacted with your lab results within the next 2 weeks.  If you have not heard from us then please contact us. The fastest way to get your results is to register for My Chart. ° ° °IF you received an x-ray today, you will receive an invoice from [facility]. Please contact [facility] at [phone] with questions or concerns regarding your invoice.  ° °IF you received labwork today, you will receive an invoice from [facility]. Please contact [facility] at [phone] with questions or concerns regarding your invoice.  ° °Our billing staff will not be able to assist you with questions regarding bills from these companies. ° °You will be contacted with the lab results as soon as they are available. The fastest way to get your results is to activate your My Chart account. Instructions are located on the last page of this paperwork. If you have not heard from us regarding the results in 2 weeks, please contact this office. °  ° ° ° °

## 2020-08-12 NOTE — Progress Notes (Signed)
10/4/20213:58 PM  Dominique Burton 1966-01-23, 54 y.o., female [id number]  Chief Complaint  Patient presents with  . Medication Refill    Patient states she is here for medication refills on all medications and lab work. Patient has no concerns    HPI:   Patient is a 54 y.o. female with past medical history significant for HTN, hypokalemia, vitamin D deficiency, h/o iron def anemia, GERD here for routine followup  She is overall doing  She checks BP at home, ~ 120/80s They have been working as a family to work on diet and exercise She is taking her meds as rx wo issues She is still taking high dose vitamin D She is requesting referral to medical weight wellness She feels lexapro is working well for her mood wise   Depression screen [facility] 2/9 08/12/2020 04/29/2020 03/04/2020  Decreased Interest 0 0 0  Down, Depressed, Hopeless 0 0 0  PHQ - 2 Score 0 0 0    Fall Risk  08/12/2020 04/29/2020 03/04/2020 01/19/2020 10/10/2019  Falls in the past year? 0 0 0 0 0  Number falls in past yr: 0 0 - 0 0  Injury with Fall? 0 0 - 0 0  Follow up Falls evaluation completed Falls evaluation completed Falls evaluation completed Falls evaluation completed -     No Known Allergies  Prior to Admission medications   Medication Sig Start Date End Date Taking? Authorizing Provider  cetirizine (ZYRTEC) 10 MG tablet Take 1 tablet (10 mg total) by mouth daily. 01/28/19  Yes Myles Lipps, MD  escitalopram (LEXAPRO) 5 MG tablet TAKE 1 TABLET BY MOUTH AT BEDTIME 04/26/20  Yes Myles Lipps, MD  famotidine (PEPCID) 20 MG tablet Take 1 tablet (20 mg total) by mouth 2 (two) times daily. 07/07/19  Yes Myles Lipps, MD  fluticasone (FLONASE) 50 MCG/ACT nasal spray Place 1 spray into both nostrils 2 (two) times daily. 01/28/19  Yes Myles Lipps, MD  lisinopril-hydrochlorothiazide (ZESTORETIC) 20-25 MG tablet Take 1 tablet by mouth once daily 07/24/20  Yes Myles Lipps, MD  nabumetone (RELAFEN) 500 MG  tablet Take 0.5-1 tablets (250-500 mg total) by mouth 2 (two) times daily as needed. 06/18/20  Yes Hilts, Casimiro Needle, MD  potassium chloride SA (KLOR-CON) 20 MEQ tablet Take 1 tablet by mouth once daily 07/24/20  Yes Myles Lipps, MD  Vitamin D, Ergocalciferol, (DRISDOL) 1.25 MG (50000 UNIT) CAPS capsule TAKE 1 CAPSULE BY MOUTH EVERY 7 DAYS 04/26/20  Yes Myles Lipps, MD    Past Medical History:  Diagnosis Date  . Hypertension   . Vitamin D deficiency     Past Surgical History:  Procedure Laterality Date  . ABDOMINAL HYSTERECTOMY     PARTIAL  . CESAREAN SECTION      Social History   Tobacco Use  . Smoking status: Never Smoker  . Smokeless tobacco: Never Used  Substance Use Topics  . Alcohol use: No    Family History  Problem Relation Age of Onset  . Hypertension Mother   . Diabetes Father   . Hypertension Father   . Hypertension Sister   . Heart disease Brother   . Hypertension Brother   . Hypertension Maternal Grandmother   . Hypertension Maternal Grandfather     Review of Systems  Constitutional: Negative for chills and fever.  Respiratory: Negative for cough and shortness of breath.   Cardiovascular: Negative for chest pain, palpitations and leg swelling.  Gastrointestinal: Negative for abdominal  pain, nausea and vomiting.     OBJECTIVE:  Today's Vitals   08/12/20 1525 08/12/20 1607  BP: (!) 168/94 140/90  Pulse: 73   Resp: 18   Temp: 97.9 F (36.6 C)   TempSrc: Temporal   SpO2: 98%   Weight: 247 lb 9.6 oz (112.3 kg)   Height: 5\' 6"  (1.676 m)    Body mass index is 39.96 kg/m.      Physical Exam Vitals and nursing note reviewed.  Constitutional:      Appearance: She is well-developed.  HENT:     Head: Normocephalic and atraumatic.     Mouth/Throat:     Pharynx: No oropharyngeal exudate.  Eyes:     General: No scleral icterus.    Extraocular Movements: Extraocular movements intact.     Conjunctiva/sclera: Conjunctivae normal.      Pupils: Pupils are equal, round, and reactive to light.  Cardiovascular:     Rate and Rhythm: Normal rate and regular rhythm.     Heart sounds: Normal heart sounds. No murmur heard.  No friction rub. No gallop.   Pulmonary:     Effort: Pulmonary effort is normal.     Breath sounds: Normal breath sounds. No wheezing, rhonchi or rales.  Musculoskeletal:     Cervical back: Neck supple.  Skin:    General: Skin is warm and dry.  Neurological:     Mental Status: She is alert and oriented to person, place, and time.     No results found for this or any previous visit (from the past 24 hour(s)).  No results found.   ASSESSMENT and PLAN  1. Essential hypertension Controlled. Continue current regime.  - Comprehensive metabolic panel - Lipid panel - Amb Ref to Medical Weight Management - Hemoglobin A1c - potassium chloride SA (KLOR-CON) 20 MEQ tablet; Take 1 tablet (20 mEq total) by mouth daily.  2. Vitamin D deficiency Labs pending, start OTC 2000 units daily if WNL - Vitamin D, 25-hydroxy  3. BMI 39.0-39.9,adult - Amb Ref to Medical Weight Management  Other orders - escitalopram (LEXAPRO) 5 MG tablet; Take 1 tablet (5 mg total) by mouth at bedtime. - famotidine (PEPCID) 20 MG tablet; Take 1 tablet (20 mg total) by mouth 2 (two) times daily. - lisinopril-hydrochlorothiazide (ZESTORETIC) 20-25 MG tablet; Take 1 tablet by mouth daily.  Return in about 6 months (around 02/10/2021).    04/12/2021, MD [facility] at [facility] [address] [place], [place] Ph.  [phone] Fax [phone]

## 2020-08-13 ENCOUNTER — Ambulatory Visit: Payer: 59

## 2020-08-13 ENCOUNTER — Ambulatory Visit (INDEPENDENT_AMBULATORY_CARE_PROVIDER_SITE_OTHER): Payer: 59 | Admitting: Family Medicine

## 2020-08-13 ENCOUNTER — Telehealth: Payer: Self-pay

## 2020-08-13 ENCOUNTER — Ambulatory Visit: Payer: 59 | Admitting: Family Medicine

## 2020-08-13 ENCOUNTER — Other Ambulatory Visit: Payer: Self-pay

## 2020-08-13 DIAGNOSIS — Z111 Encounter for screening for respiratory tuberculosis: Secondary | ICD-10-CM

## 2020-08-13 LAB — VITAMIN D 25 HYDROXY (VIT D DEFICIENCY, FRACTURES): Vit D, 25-Hydroxy: 31.4 ng/mL (ref 30.0–100.0)

## 2020-08-13 LAB — COMPREHENSIVE METABOLIC PANEL
ALT: 14 IU/L (ref 0–32)
AST: 16 IU/L (ref 0–40)
Albumin/Globulin Ratio: 1.7 (ref 1.2–2.2)
Albumin: 4.5 g/dL (ref 3.8–4.9)
Alkaline Phosphatase: 97 IU/L (ref 44–121)
BUN/Creatinine Ratio: 11 (ref 9–23)
BUN: 10 mg/dL (ref 6–24)
Bilirubin Total: 0.4 mg/dL (ref 0.0–1.2)
CO2: 29 mmol/L (ref 20–29)
Calcium: 9.7 mg/dL (ref 8.7–10.2)
Chloride: 102 mmol/L (ref 96–106)
Creatinine, Ser: 0.89 mg/dL (ref 0.57–1.00)
GFR calc Af Amer: 84 mL/min/{1.73_m2} (ref >59)
GFR calc non Af Amer: 73 mL/min/{1.73_m2} (ref >59)
Globulin, Total: 2.7 g/dL (ref 1.5–4.5)
Glucose: 103 mg/dL — ABNORMAL HIGH (ref 65–99)
Potassium: 3.9 mmol/L (ref 3.5–5.2)
Sodium: 144 mmol/L (ref 134–144)
Total Protein: 7.2 g/dL (ref 6.0–8.5)

## 2020-08-13 LAB — HEMOGLOBIN A1C
Est. average glucose Bld gHb Est-mCnc: 128 mg/dL
Hgb A1c MFr Bld: 6.1 % — ABNORMAL HIGH (ref 4.8–5.6)

## 2020-08-13 LAB — LIPID PANEL
Chol/HDL Ratio: 3 ratio (ref 0.0–4.4)
Cholesterol, Total: 161 mg/dL (ref 100–199)
HDL: 53 mg/dL (ref >39)
LDL Chol Calc (NIH): 86 mg/dL (ref 0–99)
Triglycerides: 124 mg/dL (ref 0–149)
VLDL Cholesterol Cal: 22 mg/dL (ref 5–40)

## 2020-08-13 NOTE — Telephone Encounter (Signed)
Can someone call [facility] 08/14/2020 pt had labs drawn 08/12/2020 and we can add CBC for a white blood cell count

## 2020-08-13 NOTE — Telephone Encounter (Signed)
Pt. Called requesting a complete metabolic panel if at all possible. Pt. Expressed concern about getting a white blood cell count

## 2020-08-13 NOTE — Patient Instructions (Signed)
° ° ° °  If you have lab work done today you will be contacted with your lab results within the next 2 weeks.  If you have not heard from us then please contact us. The fastest way to get your results is to register for My Chart. ° ° °IF you received an x-ray today, you will receive an invoice from [facility]. Please contact [facility] at [phone] with questions or concerns regarding your invoice.  ° °IF you received labwork today, you will receive an invoice from [facility]. Please contact [facility] at [phone] with questions or concerns regarding your invoice.  ° °Our billing staff will not be able to assist you with questions regarding bills from these companies. ° °You will be contacted with the lab results as soon as they are available. The fastest way to get your results is to activate your My Chart account. Instructions are located on the last page of this paperwork. If you have not heard from us regarding the results in 2 weeks, please contact this office. °  ° ° ° °

## 2020-08-15 ENCOUNTER — Other Ambulatory Visit: Payer: Self-pay

## 2020-08-15 ENCOUNTER — Ambulatory Visit: Payer: 59

## 2020-08-15 DIAGNOSIS — Z111 Encounter for screening for respiratory tuberculosis: Secondary | ICD-10-CM | POA: Diagnosis not present

## 2020-08-22 ENCOUNTER — Telehealth: Payer: Self-pay | Admitting: Family Medicine

## 2020-08-22 DIAGNOSIS — M25562 Pain in left knee: Secondary | ICD-10-CM

## 2020-08-22 NOTE — Telephone Encounter (Signed)
Pt called stating she believes she needs a MRI for her knee; pt would like to know does she need to see Dr.Hilts in the office in order to have a referral sent to [facility]?  [phone]

## 2020-08-26 NOTE — Telephone Encounter (Signed)
I left voicemail for patient advising. 

## 2020-08-26 NOTE — Telephone Encounter (Signed)
MRI ordered

## 2020-08-29 ENCOUNTER — Ambulatory Visit: Payer: 59 | Admitting: Family Medicine

## 2020-08-30 ENCOUNTER — Ambulatory Visit: Payer: 59

## 2020-09-02 ENCOUNTER — Ambulatory Visit (INDEPENDENT_AMBULATORY_CARE_PROVIDER_SITE_OTHER): Payer: 59 | Admitting: Family Medicine

## 2020-09-03 ENCOUNTER — Encounter: Payer: Self-pay | Admitting: Family Medicine

## 2020-09-14 ENCOUNTER — Other Ambulatory Visit: Payer: 59

## 2020-09-16 ENCOUNTER — Ambulatory Visit (INDEPENDENT_AMBULATORY_CARE_PROVIDER_SITE_OTHER): Payer: 59 | Admitting: Family Medicine

## 2020-09-24 ENCOUNTER — Telehealth: Payer: Self-pay | Admitting: Family Medicine

## 2020-09-24 ENCOUNTER — Other Ambulatory Visit: Payer: Self-pay

## 2020-09-24 ENCOUNTER — Ambulatory Visit (INDEPENDENT_AMBULATORY_CARE_PROVIDER_SITE_OTHER): Payer: 59 | Admitting: Family Medicine

## 2020-09-24 ENCOUNTER — Encounter: Payer: Self-pay | Admitting: Family Medicine

## 2020-09-24 DIAGNOSIS — M25562 Pain in left knee: Secondary | ICD-10-CM

## 2020-09-24 NOTE — Progress Notes (Signed)
   Office Visit Note   Patient: Dominique Burton           Date of Birth: 1966/03/19           MRN: [id number] Visit Date: 09/24/2020 Requested by: No referring provider defined for this encounter. PCP: Myles Lipps, MD (Inactive)  Subjective: Chief Complaint  Patient presents with  . Left Knee - Pain, Follow-up    Pain on the medial side of knee.  Has lots of questions about getting the injection and doing therapy. 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     HPI: She is here with persistent left knee pain.  Ongoing pain on the medial aspect.  She is very reluctant to consider surgery, would like to try an injection.  Diabetes has been pretty well controlled.              ROS:   All other systems were reviewed and are negative.  Objective: Vital Signs: There were no vitals taken for this visit.  Physical Exam:  General:  Alert and oriented, in no acute distress. Pulm:  Breathing unlabored. Psy:  Normal mood, congruent affect.  Left knee: 1+ effusion with no warmth.  Tender on the medial joint line.  Full range of motion.   Imaging: No results found.  Assessment & Plan: 1.  Left knee pain, suspicious for degenerative medial meniscus tear -Elected to inject with cortisone today.  If she fails to improve, then we will reattempt getting MRI approval.     Procedures: Left knee injection: After sterile prep with Betadine, injected 5 cc 1% lidocaine without epinephrine and 40 mg methylprednisolone from superolateral approach, a flash of clear yellow synovial fluid was obtained prior to injection.    PMFS History: Patient Active Problem List   Diagnosis Date Noted  . Essential hypertension 01/11/2018  . Vitamin D deficiency 01/11/2018  . Hypokalemia 01/11/2018   Past Medical History:  Diagnosis Date  . Hypertension   . Vitamin D deficiency     Family History  Problem Relation Age of Onset  . Hypertension Mother   . Diabetes Father   .  Hypertension Father   . Hypertension Sister   . Heart disease Brother   . Hypertension Brother   . Hypertension Maternal Grandmother   . Hypertension Maternal Grandfather     Past Surgical History:  Procedure Laterality Date  . ABDOMINAL HYSTERECTOMY     PARTIAL  . CESAREAN SECTION     Social History   Occupational History  . Not on file  Tobacco Use  . Smoking status: Never Smoker  . Smokeless tobacco: Never Used  Substance and Sexual Activity  . Alcohol use: No  . Drug use: No  . Sexual activity: Yes

## 2020-09-24 NOTE — Telephone Encounter (Signed)
Coming in this afternoon for follow up.

## 2020-09-24 NOTE — Telephone Encounter (Signed)
Patient returning her call to Terri. Patient phone number is [phone].

## 2020-10-21 ENCOUNTER — Other Ambulatory Visit: Payer: 59

## 2020-10-21 DIAGNOSIS — Z20822 Contact with and (suspected) exposure to covid-19: Secondary | ICD-10-CM

## 2020-10-24 ENCOUNTER — Telehealth: Payer: Self-pay | Admitting: *Deleted

## 2020-10-24 LAB — NOVEL CORONAVIRUS, NAA

## 2020-10-24 NOTE — Telephone Encounter (Signed)
Patient is calling to check on her COVID test results- everyone else has gotten results but her(all negative). Reviewed reasons for lab result delays, called lab- still pending for review, patient notified and reassured. Reviewed possible results and what they mean- patient will await MyChart- notification and call back with any questions.

## 2020-12-31 ENCOUNTER — Other Ambulatory Visit: Payer: Self-pay

## 2021-01-01 ENCOUNTER — Ambulatory Visit: Payer: 59 | Admitting: Nurse Practitioner

## 2021-01-02 ENCOUNTER — Ambulatory Visit (INDEPENDENT_AMBULATORY_CARE_PROVIDER_SITE_OTHER): Payer: 59 | Admitting: Nurse Practitioner

## 2021-01-02 ENCOUNTER — Telehealth: Payer: Self-pay | Admitting: Nurse Practitioner

## 2021-01-02 ENCOUNTER — Encounter: Payer: Self-pay | Admitting: Nurse Practitioner

## 2021-01-02 ENCOUNTER — Other Ambulatory Visit: Payer: Self-pay

## 2021-01-02 VITALS — BP 140/88 | HR 78 | Temp 97.5°F | Ht 66.0 in | Wt 247.2 lb

## 2021-01-02 DIAGNOSIS — F4322 Adjustment disorder with anxiety: Secondary | ICD-10-CM

## 2021-01-02 DIAGNOSIS — I10 Essential (primary) hypertension: Secondary | ICD-10-CM

## 2021-01-02 DIAGNOSIS — J302 Other seasonal allergic rhinitis: Secondary | ICD-10-CM

## 2021-01-02 DIAGNOSIS — Z1211 Encounter for screening for malignant neoplasm of colon: Secondary | ICD-10-CM

## 2021-01-02 DIAGNOSIS — E66812 Obesity, class 2: Secondary | ICD-10-CM | POA: Insufficient documentation

## 2021-01-02 DIAGNOSIS — E876 Hypokalemia: Secondary | ICD-10-CM | POA: Diagnosis not present

## 2021-01-02 DIAGNOSIS — R739 Hyperglycemia, unspecified: Secondary | ICD-10-CM | POA: Insufficient documentation

## 2021-01-02 DIAGNOSIS — Z6839 Body mass index (BMI) 39.0-39.9, adult: Secondary | ICD-10-CM

## 2021-01-02 NOTE — Telephone Encounter (Signed)
Patient would like an order placed for a Colonoscopy. Please call her at [phone] if you have any questions.

## 2021-01-02 NOTE — Patient Instructions (Signed)
Go to lab for blood draw  Exercising to Stay Healthy To become healthy and stay healthy, it is recommended that you do moderate-intensity and vigorous-intensity exercise. You can tell that you are exercising at a moderate intensity if your heart starts beating faster and you start breathing faster but can still hold a conversation. You can tell that you are exercising at a vigorous intensity if you are breathing much harder and faster and cannot hold a conversation while exercising. Exercising regularly is important. It has many health benefits, such as:  Improving overall fitness, flexibility, and endurance.  Increasing bone density.  Helping with weight control.  Decreasing body fat.  Increasing muscle strength.  Reducing stress and tension.  Improving overall health. How often should I exercise? Choose an activity that you enjoy, and set realistic goals. Your health care provider can help you make an activity plan that works for you. Exercise regularly as told by your health care provider. This may include:  Doing strength training two times a week, such as: ? Lifting weights. ? Using resistance bands. ? Push-ups. ? Sit-ups. ? Yoga.  Doing a certain intensity of exercise for a given amount of time. Choose from these options: ? A total of 150 minutes of moderate-intensity exercise every week. ? A total of 75 minutes of vigorous-intensity exercise every week. ? A mix of moderate-intensity and vigorous-intensity exercise every week. Children, pregnant women, people who have not exercised regularly, people who are overweight, and older adults may need to talk with a health care provider about what activities are safe to do. If you have a medical condition, be sure to talk with your health care provider before you start a new exercise program. What are some exercise ideas? Moderate-intensity exercise ideas include:  Walking 1 mile (1.6 km) in about 15  minutes.  Biking.  Hiking.  Golfing.  Dancing.  Water aerobics. Vigorous-intensity exercise ideas include:  Walking 4.5 miles (7.2 km) or more in about 1 hour.  Jogging or running 5 miles (8 km) in about 1 hour.  Biking 10 miles (16.1 km) or more in about 1 hour.  Lap swimming.  Roller-skating or in-line skating.  Cross-country skiing.  Vigorous competitive sports, such as football, basketball, and soccer.  Jumping rope.  Aerobic dancing.   What are some everyday activities that can help me to get exercise?  Yard work, such as: ? Pushing a Surveyor, mining. ? Raking and bagging leaves.  Washing your car.  Pushing a stroller.  Shoveling snow.  Gardening.  Washing windows or floors. How can I be more active in my day-to-day activities?  Use stairs instead of an elevator.  Take a walk during your lunch break.  If you drive, park your car farther away from your work or school.  If you take public transportation, get off one stop early and walk the rest of the way.  Stand up or walk around during all of your indoor phone calls.  Get up, stretch, and walk around every 30 minutes throughout the day.  Enjoy exercise with a friend. Support to continue exercising will help you keep a regular routine of activity. What guidelines can I follow while exercising?  Before you start a new exercise program, talk with your health care provider.  Do not exercise so much that you hurt yourself, feel dizzy, or get very short of breath.  Wear comfortable clothes and wear shoes with good support.  Drink plenty of water while you exercise to prevent dehydration  or heat stroke.  Work out until your breathing and your heartbeat get faster. Where to find more information  U.S. Department of Health and Human Services: ThisPath.fi  Centers for Disease Control and Prevention (CDC): FootballExhibition.com.br Summary  Exercising regularly is important. It will improve your overall fitness,  flexibility, and endurance.  Regular exercise also will improve your overall health. It can help you control your weight, reduce stress, and improve your bone density.  Do not exercise so much that you hurt yourself, feel dizzy, or get very short of breath.  Before you start a new exercise program, talk with your health care provider. This information is not intended to replace advice given to you by your health care provider. Make sure you discuss any questions you have with your health care provider. Document Revised: 10/08/2017 Document Reviewed: 09/16/2017 Elsevier Patient Education  2021 Elsevier Inc.   Calorie Counting for [employer] Calories are units of energy. Your body needs a certain number of calories from food to keep going throughout the day. When you eat or drink more calories than your body needs, your body stores the extra calories mostly as fat. When you eat or drink fewer calories than your body needs, your body burns fat to get the energy it needs. Calorie counting means keeping track of how many calories you eat and drink each day. Calorie counting can be helpful if you need to lose weight. If you eat fewer calories than your body needs, you should lose weight. Ask your health care provider what a healthy weight is for you. For calorie counting to work, you will need to eat the right number of calories each day to lose a healthy amount of weight per week. A dietitian can help you figure out how many calories you need in a day and will suggest ways to reach your calorie goal.  A healthy amount of weight to lose each week is usually 1-2 lb (0.5-0.9 kg). This usually means that your daily calorie intake should be reduced by 500-750 calories.  Eating 1,200-1,500 calories a day can help most women lose weight.  Eating 1,500-1,800 calories a day can help most men lose weight. What do I need to know about calorie counting? Work with your health care provider or dietitian to  determine how many calories you should get each day. To meet your daily calorie goal, you will need to:  Find out how many calories are in each food that you would like to eat. Try to do this before you eat.  Decide how much of the food you plan to eat.  Keep a food log. Do this by writing down what you ate and how many calories it had. To successfully lose weight, it is important to balance calorie counting with a healthy lifestyle that includes regular activity. Where do I find calorie information? The number of calories in a food can be found on a Nutrition Facts label. If a food does not have a Nutrition Facts label, try to look up the calories online or ask your dietitian for help. Remember that calories are listed per serving. If you choose to have more than one serving of a food, you will have to multiply the calories per serving by the number of servings you plan to eat. For example, the label on a package of bread might say that a serving size is 1 slice and that there are 90 calories in a serving. If you eat 1 slice, you will have  eaten 90 calories. If you eat 2 slices, you will have eaten 180 calories.   How do I keep a food log? After each time that you eat, record the following in your food log as soon as possible:  What you ate. Be sure to include toppings, sauces, and other extras on the food.  How much you ate. This can be measured in cups, ounces, or number of items.  How many calories were in each food and drink.  The total number of calories in the food you ate. Keep your food log near you, such as in a pocket-sized notebook or on an app or website on your mobile phone. Some programs will calculate calories for you and show you how many calories you have left to meet your daily goal. What are some portion-control tips?  Know how many calories are in a serving. This will help you know how many servings you can have of a certain food.  Use a measuring cup to measure serving  sizes. You could also try weighing out portions on a kitchen scale. With time, you will be able to estimate serving sizes for some foods.  Take time to put servings of different foods on your favorite plates or in your favorite bowls and cups so you know what a serving looks like.  Try not to eat straight from a food's packaging, such as from a bag or box. Eating straight from the package makes it hard to see how much you are eating and can lead to overeating. Put the amount you would like to eat in a cup or on a plate to make sure you are eating the right portion.  Use smaller plates, glasses, and bowls for smaller portions and to prevent overeating.  Try not to multitask. For example, avoid watching TV or using your computer while eating. If it is time to eat, sit down at a table and enjoy your food. This will help you recognize when you are full. It will also help you be more mindful of what and how much you are eating. What are tips for following this plan? Reading food labels  Check the calorie count compared with the serving size. The serving size may be smaller than what you are used to eating.  Check the source of the calories. Try to choose foods that are high in protein, fiber, and vitamins, and low in saturated fat, trans fat, and sodium. Shopping  Read nutrition labels while you shop. This will help you make healthy decisions about which foods to buy.  Pay attention to nutrition labels for low-fat or fat-free foods. These foods sometimes have the same number of calories or more calories than the full-fat versions. They also often have added sugar, starch, or salt to make up for flavor that was removed with the fat.  Make a grocery list of lower-calorie foods and stick to it. Cooking  Try to cook your favorite foods in a healthier way. For example, try baking instead of frying.  Use low-fat dairy products. Meal planning  Use more fruits and vegetables. One-half of your plate  should be fruits and vegetables.  Include lean proteins, such as chicken, [place], and fish. Lifestyle Each week, aim to do one of the following:  150 minutes of moderate exercise, such as walking.  75 minutes of vigorous exercise, such as running. General information  Know how many calories are in the foods you eat most often. This will help you calculate calorie counts  faster.  Find a way of tracking calories that works for you. Get creative. Try different apps or programs if writing down calories does not work for you. What foods should I eat?  Eat nutritious foods. It is better to have a nutritious, high-calorie food, such as an avocado, than a food with few nutrients, such as a bag of potato chips.  Use your calories on foods and drinks that will fill you up and will not leave you hungry soon after eating. ? Examples of foods that fill you up are nuts and nut butters, vegetables, lean proteins, and high-fiber foods such as whole grains. High-fiber foods are foods with more than 5 g of fiber per serving.  Pay attention to calories in drinks. Low-calorie drinks include water and unsweetened drinks. The items listed above may not be a complete list of foods and beverages you can eat. Contact a dietitian for more information.   What foods should I limit? Limit foods or drinks that are not good sources of vitamins, minerals, or protein or that are high in unhealthy fats. These include:  Candy.  Other sweets.  Sodas, specialty coffee drinks, alcohol, and juice. The items listed above may not be a complete list of foods and beverages you should avoid. Contact a dietitian for more information. How do I count calories when eating out?  Pay attention to portions. Often, portions are much larger when eating out. Try these tips to keep portions smaller: ? Consider sharing a meal instead of getting your own. ? If you get your own meal, eat only half of it. Before you start eating, ask for  a container and put half of your meal into it. ? When available, consider ordering smaller portions from the menu instead of full portions.  Pay attention to your food and drink choices. Knowing the way food is cooked and what is included with the meal can help you eat fewer calories. ? If calories are listed on the menu, choose the lower-calorie options. ? Choose dishes that include vegetables, fruits, whole grains, low-fat dairy products, and lean proteins. ? Choose items that are boiled, broiled, grilled, or steamed. Avoid items that are buttered, battered, fried, or served with cream sauce. Items labeled as crispy are usually fried, unless stated otherwise. ? Choose water, low-fat milk, unsweetened iced tea, or other drinks without added sugar. If you want an alcoholic beverage, choose a lower-calorie option, such as a glass of wine or light beer. ? Ask for dressings, sauces, and syrups on the side. These are usually high in calories, so you should limit the amount you eat. ? If you want a salad, choose a garden salad and ask for grilled meats. Avoid extra toppings such as bacon, cheese, or fried items. Ask for the dressing on the side, or ask for olive oil and vinegar or lemon to use as dressing.  Estimate how many servings of a food you are given. Knowing serving sizes will help you be aware of how much food you are eating at restaurants. Where to find more information  Centers for Disease Control and Prevention: FootballExhibition.com.brwww.cdc.gov  [place].S. Department of Agriculture: WrestlingReporter.dkmyplate.gov Summary  Calorie counting means keeping track of how many calories you eat and drink each day. If you eat fewer calories than your body needs, you should lose weight.  A healthy amount of weight to lose per week is usually 1-2 lb (0.5-0.9 kg). This usually means reducing your daily calorie intake by 500-750 calories.  The  number of calories in a food can be found on a Nutrition Facts label. If a food does not have a  Nutrition Facts label, try to look up the calories online or ask your dietitian for help.  Use smaller plates, glasses, and bowls for smaller portions and to prevent overeating.  Use your calories on foods and drinks that will fill you up and not leave you hungry shortly after a meal. This information is not intended to replace advice given to you by your health care provider. Make sure you discuss any questions you have with your health care provider. Document Revised: 12/07/2019 Document Reviewed: 12/07/2019 Elsevier Patient Education  2021 [employer].

## 2021-01-02 NOTE — Assessment & Plan Note (Addendum)
Stable BP with lisinopril/HCTZ Intermittent muscle cramps with HCTZ BP Readings from Last 3 Encounters:  01/02/21 140/88  08/12/20 140/90  04/29/20 128/84   Maintain current medication Repeat BMP: hypokalemia Increase KCL to BID Maintain lisinopril/hctz dose. Refill sent Advised to start daily exercise: 30-39mins a day F/up in 46months

## 2021-01-02 NOTE — Assessment & Plan Note (Signed)
We discussed possible complications from obesity: OA, heart disease, DM, hyperlipidemia, and cancer. Advised to incorporate small meal portions and daily exercise. Entered referral to weight management clinic

## 2021-01-02 NOTE — Assessment & Plan Note (Signed)
Repeat Hgb A1c

## 2021-01-02 NOTE — Progress Notes (Signed)
Subjective:  Patient ID: Dominique Burton, female    DOB: 1966/06/16  Age: 55 y.o. MRN: [id number]  CC: Establish Care (NP/establish care, concerns about left knee possible blood clots. Elevated BP readings would like to start on BP medication with potassium. )  HPI  Essential hypertension Stable BP with lisinopril/HCTZ Intermittent muscle cramps with HCTZ BP Readings from Last 3 Encounters:  01/02/21 140/88  08/12/20 140/90  04/29/20 128/84   Maintain current medication Repeat BMP: hypokalemia Increase KCL to BID Maintain lisinopril/hctz dose. Refill sent Advised to start daily exercise: 30-12mins a day F/up in 44months  Hyperglycemia Repeat HgbA1c  Class 2 severe obesity due to excess calories with serious comorbidity and body mass index (BMI) of 39.0 to 39.9 in adult [facility]) We discussed possible complications from obesity: OA, heart disease, DM, hyperlipidemia, and cancer. Advised to incorporate small meal portions and daily exercise. Entered referral to weight management clinic  BP Readings from Last 3 Encounters:  01/02/21 140/88  08/12/20 140/90  04/29/20 128/84   Reviewed past Medical, Social and Family history today.  Outpatient Medications Prior to Visit  Medication Sig Dispense Refill   cetirizine (ZYRTEC) 10 MG tablet Take 1 tablet (10 mg total) by mouth daily. 30 tablet 11   escitalopram (LEXAPRO) 5 MG tablet Take 1 tablet (5 mg total) by mouth at bedtime. 90 tablet 1   famotidine (PEPCID) 20 MG tablet Take 1 tablet (20 mg total) by mouth 2 (two) times daily. 30 tablet 0   fluticasone (FLONASE) 50 MCG/ACT nasal spray Place 1 spray into both nostrils 2 (two) times daily. 16 g 6   Vitamin D, Ergocalciferol, (DRISDOL) 1.25 MG (50000 UNIT) CAPS capsule Take 1 capsule by mouth once a week 12 capsule 0   lisinopril-hydrochlorothiazide (ZESTORETIC) 20-25 MG tablet Take 1 tablet by mouth daily. 90 tablet 1   potassium chloride SA (KLOR-CON) 20 MEQ tablet Take  1 tablet (20 mEq total) by mouth daily. 90 tablet 1   nabumetone (RELAFEN) 500 MG tablet Take 0.5-1 tablets (250-500 mg total) by mouth 2 (two) times daily as needed. (Patient not taking: Reported on 01/02/2021) 60 tablet 3   No facility-administered medications prior to visit.    ROS See HPI  Objective:  BP 140/88 (BP Location: Left Arm, Patient Position: Sitting, Cuff Size: Large)    Pulse 78    Temp (!) 97.5 F (36.4 C) (Temporal)    Ht 5\' 6"  (1.676 m)    Wt 247 lb 3.2 oz (112.1 kg)    SpO2 97%    BMI 39.90 kg/m   Physical Exam Constitutional:      Appearance: She is obese.  Cardiovascular:     Rate and Rhythm: Normal rate and regular rhythm.     Pulses: Normal pulses.     Heart sounds: Normal heart sounds.  Pulmonary:     Effort: Pulmonary effort is normal.     Breath sounds: Normal breath sounds.  Musculoskeletal:     Right knee: Crepitus present. Normal range of motion. No tenderness. No medial joint line, lateral joint line or patellar tendon tenderness. Normal pulse.     Left knee: Crepitus present. No swelling, effusion or erythema. Normal range of motion. Tenderness present over the medial joint line. No lateral joint line or patellar tendon tenderness. Normal pulse.     Right lower leg: Normal. No edema.     Left lower leg: Normal. No edema.  Skin:    General: Skin is warm and  dry.     Findings: No erythema or rash.  Neurological:     Mental Status: She is alert and oriented to person, place, and time.    Assessment & Plan:  This visit occurred during the SARS-CoV-2 public health emergency.  Safety protocols were in place, including screening questions prior to the visit, additional usage of staff PPE, and extensive cleaning of exam room while observing appropriate contact time as indicated for disinfecting solutions.   Dominique Burton was seen today for establish care.  Diagnoses and all orders for this visit:  Essential hypertension -     Basic metabolic panel -      Amb Ref to Medical Weight Management -     lisinopril-hydrochlorothiazide (ZESTORETIC) 20-25 MG tablet; Take 1 tablet by mouth daily. -     potassium chloride SA (KLOR-CON) 20 MEQ tablet; Take 1 tablet (20 mEq total) by mouth 2 (two) times daily.  Hypokalemia -     Basic metabolic panel -     potassium chloride SA (KLOR-CON) 20 MEQ tablet; Take 1 tablet (20 mEq total) by mouth 2 (two) times daily.  Hyperglycemia -     Hemoglobin A1c -     Amb Ref to Medical Weight Management  Class 2 severe obesity due to excess calories with serious comorbidity and body mass index (BMI) of 39.0 to 39.9 in adult (HCC) -     Amb Ref to Medical Weight Management    Problem List Items Addressed This Visit      Cardiovascular and Mediastinum   Essential hypertension - Primary    Stable BP with lisinopril/HCTZ Intermittent muscle cramps with HCTZ BP Readings from Last 3 Encounters:  01/02/21 140/88  08/12/20 140/90  04/29/20 128/84   Maintain current medication Repeat BMP: hypokalemia Increase KCL to BID Maintain lisinopril/hctz dose. Refill sent Advised to start daily exercise: 30-77mins a day F/up in 22months      Relevant Medications   lisinopril-hydrochlorothiazide (ZESTORETIC) 20-25 MG tablet   potassium chloride SA (KLOR-CON) 20 MEQ tablet   Other Relevant Orders   Basic metabolic panel (Completed)   Amb Ref to Medical Weight Management     Other   Class 2 severe obesity due to excess calories with serious comorbidity and body mass index (BMI) of 39.0 to 39.9 in adult [facility])    We discussed possible complications from obesity: OA, heart disease, DM, hyperlipidemia, and cancer. Advised to incorporate small meal portions and daily exercise. Entered referral to weight management clinic      Relevant Orders   Amb Ref to Medical Weight Management   Hyperglycemia    Repeat HgbA1c      Relevant Orders   Hemoglobin A1c (Completed)   Amb Ref to Medical Weight Management    Hypokalemia   Relevant Medications   potassium chloride SA (KLOR-CON) 20 MEQ tablet   Other Relevant Orders   Basic metabolic panel (Completed)      Follow-up: Return in about 3 months (around 04/01/2021) for CPE (fasting).  Alysia Penna, NP

## 2021-01-03 LAB — BASIC METABOLIC PANEL
BUN: 12 mg/dL (ref 6–23)
CO2: 33 mEq/L — ABNORMAL HIGH (ref 19–32)
Calcium: 9.7 mg/dL (ref 8.4–10.5)
Chloride: 102 mEq/L (ref 96–112)
Creatinine, Ser: 0.79 mg/dL (ref 0.40–1.20)
GFR: 83.93 mL/min (ref >60.00)
Glucose, Bld: 102 mg/dL — ABNORMAL HIGH (ref 70–99)
Potassium: 3.3 mEq/L — ABNORMAL LOW (ref 3.5–5.1)
Sodium: 141 mEq/L (ref 135–145)

## 2021-01-03 LAB — HEMOGLOBIN A1C: Hgb A1c MFr Bld: 6.1 % (ref 4.6–6.5)

## 2021-01-03 MED ORDER — POTASSIUM CHLORIDE CRYS ER 20 MEQ PO TBCR: 20.0000 meq | EXTENDED_RELEASE_TABLET | Freq: Two times a day (BID) | ORAL | 1 refills | Status: DC

## 2021-01-03 MED ORDER — LISINOPRIL-HYDROCHLOROTHIAZIDE 20-25 MG PO TABS: 1.0000 | ORAL_TABLET | Freq: Every day | ORAL | 1 refills | Status: DC

## 2021-01-03 NOTE — Telephone Encounter (Signed)
Patient is calling to check the status of her referral for a Colonoscopy and she also stated that she was supposed to get refills on all of her medications, but the pharmacy still does not have anything. Please call her back and update.

## 2021-01-06 NOTE — Telephone Encounter (Signed)
LVM informing patient to return call.  

## 2021-01-08 MED ORDER — FLUTICASONE PROPIONATE 50 MCG/ACT NA SUSP: 1.0000 | Freq: Two times a day (BID) | NASAL | 5 refills | Status: AC

## 2021-01-08 MED ORDER — CETIRIZINE HCL 10 MG PO TABS: 10.0000 mg | ORAL_TABLET | Freq: Every day | ORAL | 5 refills | Status: DC

## 2021-01-08 MED ORDER — FAMOTIDINE 20 MG PO TABS
20.0000 mg | ORAL_TABLET | Freq: Two times a day (BID) | ORAL | 5 refills | Status: DC
Start: 2021-01-08 — End: 2021-11-21

## 2021-01-08 MED ORDER — ESCITALOPRAM OXALATE 5 MG PO TABS
5.0000 mg | ORAL_TABLET | Freq: Every day | ORAL | 1 refills | Status: DC
Start: 2021-01-08 — End: 2021-08-21

## 2021-01-08 NOTE — Telephone Encounter (Signed)
Pt states she needs referral for colonoscopy because 6 years ago she had polyps and they were cancerous and she was informed to repeat colonoscopy in 5 years. Pt also states she needs all medications refilled and sent to pharmacy.

## 2021-01-09 NOTE — Telephone Encounter (Signed)
Patient notified and verbalized understanding. 

## 2021-01-21 ENCOUNTER — Ambulatory Visit: Payer: 59 | Admitting: Nurse Practitioner

## 2021-01-27 ENCOUNTER — Encounter: Payer: Self-pay | Admitting: Nurse Practitioner

## 2021-01-27 ENCOUNTER — Telehealth: Payer: Self-pay | Admitting: Nurse Practitioner

## 2021-01-27 NOTE — Telephone Encounter (Signed)
Pt was a late arrival for appt 01/01/2021 new pt. 1st occurrence. Fee waived. Letter mailed.

## 2021-02-06 ENCOUNTER — Telehealth: Payer: Self-pay

## 2021-02-06 DIAGNOSIS — M79605 Pain in left leg: Secondary | ICD-10-CM

## 2021-02-06 DIAGNOSIS — M25562 Pain in left knee: Secondary | ICD-10-CM

## 2021-02-06 NOTE — Telephone Encounter (Signed)
Pt would like a call back from terri

## 2021-02-07 NOTE — Telephone Encounter (Signed)
I will order doppler study.

## 2021-02-07 NOTE — Telephone Encounter (Signed)
I called the patient. She continues to have medial knee pain. No swelling. She feels like "something is just not right" with the leg. The patient's cousin was just diagnosed with massive blood clots in his legs. She is worried about possibly having a blood clot in the leg, for this reason and because she has had the covid vaccine and she has heard of others developing clots due to the vaccine. Again, she has no swelling in the lower leg/calf, no redness, no warmth and no itchy sensations. Her knee MRI was denied, so she has not had the knee evaluated further. Please advise.

## 2021-02-07 NOTE — Telephone Encounter (Signed)
I called the patient and advised her of this. She is ok with it being next week. I did advise her to go to an urgent care or ED over the weekend if symptoms worsen (especially if she develops redness/warmth and/or swelling in the leg). She voiced understanding.

## 2021-02-14 ENCOUNTER — Encounter: Payer: Self-pay | Admitting: Family Medicine

## 2021-02-14 ENCOUNTER — Other Ambulatory Visit: Payer: Self-pay

## 2021-02-14 ENCOUNTER — Ambulatory Visit (HOSPITAL_COMMUNITY)
Admission: RE | Admit: 2021-02-14 | Discharge: 2021-02-14 | Disposition: A | Discharge status: Home / Self Care | Payer: 59 | Source: Ambulatory Visit | Attending: Family Medicine | Admitting: Family Medicine

## 2021-02-14 ENCOUNTER — Telehealth: Payer: Self-pay

## 2021-02-14 DIAGNOSIS — M25562 Pain in left knee: Secondary | ICD-10-CM | POA: Diagnosis present

## 2021-02-14 DIAGNOSIS — M79605 Pain in left leg: Secondary | ICD-10-CM | POA: Insufficient documentation

## 2021-02-14 NOTE — Telephone Encounter (Signed)
FYI- [facility] called wanting to let Dr. Prince Rome know that patient is Negative for DVT, left leg and has some fluid around her left knee.

## 2021-02-17 ENCOUNTER — Telehealth: Payer: Self-pay | Admitting: Family Medicine

## 2021-02-17 NOTE — Telephone Encounter (Signed)
Error

## 2021-02-17 NOTE — Telephone Encounter (Signed)
Dr. Prince Rome is aware.

## 2021-03-05 ENCOUNTER — Telehealth (INDEPENDENT_AMBULATORY_CARE_PROVIDER_SITE_OTHER): Payer: 59 | Admitting: Nurse Practitioner

## 2021-03-05 ENCOUNTER — Encounter: Payer: Self-pay | Admitting: Nurse Practitioner

## 2021-03-05 VITALS — BP 120/83 | Temp 97.0°F | Ht 66.0 in | Wt 247.0 lb

## 2021-03-05 DIAGNOSIS — J301 Allergic rhinitis due to pollen: Secondary | ICD-10-CM | POA: Diagnosis not present

## 2021-03-05 NOTE — Progress Notes (Signed)
Virtual Visit via Video Note  I connected with@ on 03/05/21 at  9:30 AM EDT by a video enabled telemedicine application and verified that I am speaking with the correct person using two identifiers.  Location: Patient:Home Provider: Office Participants: patient and provider  I discussed the limitations of evaluation and management by telemedicine and the availability of in person appointments. I also discussed with the patient that there may be a patient responsible charge related to this service. The patient expressed understanding and agreed to proceed.  CC:Pt c/o congestion and productive cough with clear mucus x 5 days. Pt states she does feel like she is getting better since starting cetrizine.   History of Present Illness: URI  This is a new problem. The current episode started in the past 7 days. The problem has been gradually improving. There has been no fever. Associated symptoms include congestion and rhinorrhea. Pertinent negatives include no coughing, headaches, sinus pain, sore throat or swollen glands. She has tried antihistamine for the symptoms. The treatment provided significant relief.   Observations/Objective: Physical Exam Pulmonary:     Effort: Pulmonary effort is normal.  Neurological:     Mental Status: She is alert and oriented to person, place, and time.    Assessment and Plan: Dominique Burton was seen today for acute visit.  Diagnoses and all orders for this visit:  Seasonal allergic rhinitis due to pollen   Follow Up Instructions: Continue zyrtec and flonase May also add mucinex-DM if needed for cough Let me know if you need benzonatate prescription. Maintain adequate oral hydration.   I discussed the assessment and treatment plan with the patient. The patient was provided an opportunity to ask questions and all were answered. The patient agreed with the plan and demonstrated an understanding of the instructions.   The patient was advised to call back or  seek an in-person evaluation if the symptoms worsen or if the condition fails to improve as anticipated.  Alysia Penna, NP

## 2021-03-05 NOTE — Patient Instructions (Signed)
Continue zyrtec and flonase May also add mucinex-DM if needed for cough Let me know if you need benzonatate prescription. Maintain adequate oral hydration

## 2021-04-07 ENCOUNTER — Ambulatory Visit
Admission: EM | Admit: 2021-04-07 | Discharge: 2021-04-07 | Disposition: A | Discharge status: Home / Self Care | Payer: 59 | Attending: Physician Assistant | Admitting: Physician Assistant

## 2021-04-07 ENCOUNTER — Other Ambulatory Visit: Payer: Self-pay

## 2021-04-07 ENCOUNTER — Encounter: Payer: Self-pay | Admitting: Emergency Medicine

## 2021-04-07 DIAGNOSIS — K219 Gastro-esophageal reflux disease without esophagitis: Secondary | ICD-10-CM

## 2021-04-07 DIAGNOSIS — R112 Nausea with vomiting, unspecified: Secondary | ICD-10-CM

## 2021-04-07 MED ORDER — ONDANSETRON HCL 4 MG PO TABS: 4.0000 mg | ORAL_TABLET | Freq: Three times a day (TID) | ORAL | 0 refills | Status: AC | PRN

## 2021-04-07 NOTE — ED Triage Notes (Addendum)
Patient noticed bloating this morning.  Patient took a reflux pill (pepcid)afterwards and then vomited.  patient had drank orange juice and banana prior to vomiting  Patient states she is feeling better.  Patient has to have a note to return to work.  Sees pcp on June 3 and plans to discuss gi issues

## 2021-04-07 NOTE — Discharge Instructions (Addendum)
Take Zofran as needed for nausea Continue with Pepcid Follow up with primary care as scheduled

## 2021-04-10 ENCOUNTER — Other Ambulatory Visit: Payer: Self-pay

## 2021-04-11 ENCOUNTER — Ambulatory Visit (INDEPENDENT_AMBULATORY_CARE_PROVIDER_SITE_OTHER): Payer: 59 | Admitting: Nurse Practitioner

## 2021-04-11 ENCOUNTER — Telehealth: Payer: Self-pay | Admitting: Nurse Practitioner

## 2021-04-11 ENCOUNTER — Encounter: Payer: Self-pay | Admitting: Nurse Practitioner

## 2021-04-11 VITALS — BP 126/84 | HR 65 | Temp 97.6°F | Ht 66.0 in | Wt 246.2 lb

## 2021-04-11 DIAGNOSIS — E559 Vitamin D deficiency, unspecified: Secondary | ICD-10-CM

## 2021-04-11 DIAGNOSIS — E876 Hypokalemia: Secondary | ICD-10-CM | POA: Diagnosis not present

## 2021-04-11 DIAGNOSIS — R739 Hyperglycemia, unspecified: Secondary | ICD-10-CM

## 2021-04-11 DIAGNOSIS — Z6839 Body mass index (BMI) 39.0-39.9, adult: Secondary | ICD-10-CM | POA: Diagnosis not present

## 2021-04-11 DIAGNOSIS — I10 Essential (primary) hypertension: Secondary | ICD-10-CM | POA: Diagnosis not present

## 2021-04-11 DIAGNOSIS — K2100 Gastro-esophageal reflux disease with esophagitis, without bleeding: Secondary | ICD-10-CM

## 2021-04-11 LAB — TSH: TSH: 1.76 u[IU]/mL (ref 0.35–4.50)

## 2021-04-11 LAB — HEMOGLOBIN A1C: Hgb A1c MFr Bld: 6.2 % (ref 4.6–6.5)

## 2021-04-11 MED ORDER — OMEPRAZOLE 20 MG PO CPDR: 20.0000 mg | DELAYED_RELEASE_CAPSULE | Freq: Two times a day (BID) | ORAL | 1 refills | Status: DC

## 2021-04-11 NOTE — Assessment & Plan Note (Signed)
BP at goal BP Readings from Last 3 Encounters:  04/11/21 126/84  04/07/21 (!) 166/96  03/05/21 120/83   Continue lisinopril/HCTZ Repeat BMP

## 2021-04-11 NOTE — Assessment & Plan Note (Signed)
-   Repeat BMP

## 2021-04-11 NOTE — Patient Instructions (Addendum)
Let me know if you choose to start saxenda injection for weight loss and prediabetes.  Go to lab for blood draw  Schedule appt with weight loss clinic.

## 2021-04-11 NOTE — Telephone Encounter (Signed)
Ok to schedule nurse visit.

## 2021-04-11 NOTE — Assessment & Plan Note (Addendum)
Repeat Vit. D: low Weekly 50,000IU sent

## 2021-04-11 NOTE — Assessment & Plan Note (Signed)
encourage to schedule appt with weight management clinic and start daily exercise.

## 2021-04-11 NOTE — Telephone Encounter (Signed)
Pt said she forgot to ask about getting a TB test for work, Is that okay to schedule for next week?

## 2021-04-11 NOTE — Progress Notes (Addendum)
Subjective:  Patient ID: Dominique Burton, female    DOB: 1966-07-17  Age: 55 y.o. MRN: [id number]  CC: Follow-up (3 month f/u HTN.  Average BP 125/84.  )   Gastroesophageal Reflux She complains of abdominal pain, belching, globus sensation, heartburn and nausea. She reports no chest pain, no choking, no coughing, no dysphagia, no early satiety, no sore throat, no stridor, no tooth decay, no water brash or no wheezing. This is a recurrent problem. The current episode started more than 1 month ago. The problem occurs constantly. The problem has been waxing and waning. The heartburn is located in the substernum. The heartburn does not wake her from sleep. The heartburn does not limit her activity. The heartburn doesn't change with position. The symptoms are aggravated by certain foods, stress and lying down. Pertinent negatives include no anemia, fatigue, melena, muscle weakness, orthopnea or weight loss. Risk factors include lack of exercise and obesity. She has tried a histamine-2 antagonist for the symptoms. The treatment provided mild relief. Past procedures do not include an abdominal ultrasound, an EGD or H. pylori antibody titer. Past invasive treatments do not include gastroplasty, gastroplication or reflux surgery.   Essential hypertension BP at goal BP Readings from Last 3 Encounters:  04/11/21 126/84  04/07/21 (!) 166/96  03/05/21 120/83   Continue lisinopril/HCTZ Repeat BMP  Vitamin D deficiency Repeat Vit. D: low Weekly 50,000IU sent  Hypokalemia Repeat BMP  Hyperglycemia States she had made dietary changes: low fat/low carb But no exercise regimen at this time.  Repeat hgbA1c: stable at 6.2% We discussed use of saxenda injection for glucose control and weight loss. She declined at this time  Class 2 severe obesity due to excess calories with serious comorbidity and body mass index (BMI) of 39.0 to 39.9 in adult [facility]) encourage to schedule appt with weight management clinic  and start daily exercise.  Wt Readings from Last 3 Encounters:  04/11/21 246 lb 3.2 oz (111.7 kg)  03/05/21 247 lb (112 kg)  01/02/21 247 lb 3.2 oz (112.1 kg)   Reviewed past Medical, Social and Family history today.  Outpatient Medications Prior to Visit  Medication Sig Dispense Refill  . cetirizine (ZYRTEC) 10 MG tablet Take 1 tablet (10 mg total) by mouth daily. 30 tablet 5  . escitalopram (LEXAPRO) 5 MG tablet Take 1 tablet (5 mg total) by mouth at bedtime. 90 tablet 1  . famotidine (PEPCID) 20 MG tablet Take 1 tablet (20 mg total) by mouth 2 (two) times daily. 60 tablet 5  . fluticasone (FLONASE) 50 MCG/ACT nasal spray Place 1 spray into both nostrils 2 (two) times daily. 16 g 5  . lisinopril-hydrochlorothiazide (ZESTORETIC) 20-25 MG tablet Take 1 tablet by mouth daily. 90 tablet 1  . ondansetron (ZOFRAN) 4 MG tablet Take 1 tablet (4 mg total) by mouth every 8 (eight) hours as needed for up to 5 days for nausea or vomiting. 15 tablet 0  . potassium chloride SA (KLOR-CON) 20 MEQ tablet Take 1 tablet (20 mEq total) by mouth 2 (two) times daily. 180 tablet 1  . Vitamin D, Ergocalciferol, (DRISDOL) 1.25 MG (50000 UNIT) CAPS capsule Take 1 capsule by mouth once a week 12 capsule 0   No facility-administered medications prior to visit.    ROS See HPI  Objective:  BP 126/84   Pulse 65   Temp 97.6 F (36.4 C) (Temporal)   Ht 5\' 6"  (1.676 m)   Wt 246 lb 3.2 oz (111.7 kg)  SpO2 97%   BMI 39.74 kg/m   Physical Exam Vitals reviewed.  Constitutional:      Appearance: She is obese.  Cardiovascular:     Rate and Rhythm: Normal rate.     Pulses: Normal pulses.  Pulmonary:     Effort: Pulmonary effort is normal.  Neurological:     Mental Status: She is alert and oriented to person, place, and time.    Assessment & Plan:  This visit occurred during the SARS-CoV-2 public health emergency.  Safety protocols were in place, including screening questions prior to the visit,  additional usage of staff PPE, and extensive cleaning of exam room while observing appropriate contact time as indicated for disinfecting solutions.   Ahliyah was seen today for follow-up.  Diagnoses and all orders for this visit:  Essential hypertension -     Basic metabolic panel  Hypokalemia -     Basic metabolic panel  Class 2 severe obesity due to excess calories with serious comorbidity and body mass index (BMI) of 39.0 to 39.9 in adult (HCC) -     Lipid panel -     TSH  Hyperglycemia -     Hemoglobin A1c  Gastroesophageal reflux disease with esophagitis without hemorrhage -     H. pylori breath test -     omeprazole (PRILOSEC) 20 MG capsule; Take 1 capsule (20 mg total) by mouth 2 (two) times daily before a meal.  Vitamin D deficiency -     Vitamin D 1,25 dihydroxy -     Vitamin D, Ergocalciferol, (DRISDOL) 1.25 MG (50000 UNIT) CAPS capsule; Take 1 capsule (50,000 Units total) by mouth once a week.    Problem List Items Addressed This Visit      Cardiovascular and Mediastinum   Essential hypertension - Primary    BP at goal BP Readings from Last 3 Encounters:  04/11/21 126/84  04/07/21 (!) 166/96  03/05/21 120/83   Continue lisinopril/HCTZ Repeat BMP      Relevant Orders   Basic metabolic panel (Completed)     Other   Class 2 severe obesity due to excess calories with serious comorbidity and body mass index (BMI) of 39.0 to 39.9 in adult [facility])    encourage to schedule appt with weight management clinic and start daily exercise.      Relevant Orders   Lipid panel (Completed)   TSH (Completed)   Hyperglycemia    States she had made dietary changes: low fat/low carb But no exercise regimen at this time.  Repeat hgbA1c: stable at 6.2% We discussed use of saxenda injection for glucose control and weight loss. She declined at this time      Relevant Orders   Hemoglobin A1c (Completed)   Hypokalemia    Repeat BMP      Relevant Orders   Basic  metabolic panel (Completed)   Vitamin D deficiency    Repeat Vit. D: low Weekly 50,000IU sent      Relevant Medications   Vitamin D, Ergocalciferol, (DRISDOL) 1.25 MG (50000 UNIT) CAPS capsule   Other Relevant Orders   Vitamin D 1,25 dihydroxy (Completed)    Other Visit Diagnoses    Gastroesophageal reflux disease with esophagitis without hemorrhage       Relevant Medications   omeprazole (PRILOSEC) 20 MG capsule   Other Relevant Orders   H. pylori breath test      Follow-up: Return in about 6 months (around 10/11/2021) for CPE (fasting).  Alysia Penna, NP

## 2021-04-11 NOTE — Assessment & Plan Note (Addendum)
States she had made dietary changes: low fat/low carb But no exercise regimen at this time.  Repeat hgbA1c: stable at 6.2% We discussed use of saxenda injection for glucose control and weight loss. She declined at this time

## 2021-04-14 LAB — LIPID PANEL
Cholesterol: 146 mg/dL (ref 0–200)
HDL: 50.3 mg/dL (ref >39.00)
LDL Cholesterol: 78 mg/dL (ref 0–99)
NonHDL: 95.67
Total CHOL/HDL Ratio: 3
Triglycerides: 89 mg/dL (ref 0.0–149.0)
VLDL: 17.8 mg/dL (ref 0.0–40.0)

## 2021-04-14 LAB — BASIC METABOLIC PANEL
BUN: 10 mg/dL (ref 6–23)
CO2: 31 mEq/L (ref 19–32)
Calcium: 9.4 mg/dL (ref 8.4–10.5)
Chloride: 101 mEq/L (ref 96–112)
Creatinine, Ser: 0.76 mg/dL (ref 0.40–1.20)
GFR: 87.75 mL/min (ref >60.00)
Glucose, Bld: 89 mg/dL (ref 70–99)
Potassium: 3.6 mEq/L (ref 3.5–5.1)
Sodium: 141 mEq/L (ref 135–145)

## 2021-04-14 LAB — VITAMIN D 1,25 DIHYDROXY
Vitamin D 1, 25 (OH)2 Total: 10 pg/mL — ABNORMAL LOW (ref 18–72)
Vitamin D2 1, 25 (OH)2: 10 pg/mL
Vitamin D3 1, 25 (OH)2: <8 pg/mL

## 2021-04-15 MED ORDER — VITAMIN D (ERGOCALCIFEROL) 1.25 MG (50000 UNIT) PO CAPS
1.0000 | ORAL_CAPSULE | ORAL | 0 refills | Status: DC
Start: 2021-04-15 — End: 2021-11-21

## 2021-04-15 NOTE — Telephone Encounter (Signed)
Done

## 2021-04-15 NOTE — Addendum Note (Signed)
Addended by: Michaela Corner on: 04/15/2021 02:39 PM   Modules accepted: Orders

## 2021-04-16 ENCOUNTER — Ambulatory Visit: Payer: 59

## 2021-06-19 ENCOUNTER — Other Ambulatory Visit: Payer: Self-pay | Admitting: Family Medicine

## 2021-07-16 ENCOUNTER — Other Ambulatory Visit: Payer: Self-pay

## 2021-07-16 ENCOUNTER — Ambulatory Visit (INDEPENDENT_AMBULATORY_CARE_PROVIDER_SITE_OTHER): Payer: 59 | Admitting: Podiatry

## 2021-07-16 ENCOUNTER — Encounter: Payer: Self-pay | Admitting: Podiatry

## 2021-07-16 DIAGNOSIS — S90111A Contusion of right great toe without damage to nail, initial encounter: Secondary | ICD-10-CM

## 2021-07-17 ENCOUNTER — Telehealth: Payer: Self-pay | Admitting: Nurse Practitioner

## 2021-07-17 NOTE — Telephone Encounter (Signed)
Pt called bc she has diarrhea, would like a nurse to call her back. She believes it's from her medicine. Would like a call back today.

## 2021-07-18 NOTE — Telephone Encounter (Signed)
LVM for patient to return call. 

## 2021-07-18 NOTE — Progress Notes (Signed)
Subjective:  Patient ID: Dominique Burton, female    DOB: [birth year],  MRN: [id number]  Chief Complaint  Patient presents with   Nail Problem    PT stated that she went to the nail salon at the end of June and they bruised her nail and she just wants to make sure her nail will heal correctly     55 y.o. female presents with the above complaint.  Patient presents with complaint of right hallux nail contusion.  Patient states that she is diabetic with last A1c of 6.2.  Patient states she went to a nail salon and stated that they bruised her nail.  She just wants to make sure that able Tylenol can cause her to lose her toe.  She has not seen anyone else prior to seeing me.  She denies any other acute complaints.  Her pain scale is 2 out of 10 and is very mild in nature if any.   Review of Systems: Negative except as noted in the HPI. Denies N/V/F/Ch.  Past Medical History:  Diagnosis Date   Hypertension    Vitamin D deficiency     Current Outpatient Medications:    cetirizine (ZYRTEC) 10 MG tablet, Take 1 tablet (10 mg total) by mouth daily., Disp: 30 tablet, Rfl: 5   escitalopram (LEXAPRO) 5 MG tablet, Take 1 tablet (5 mg total) by mouth at bedtime., Disp: 90 tablet, Rfl: 1   famotidine (PEPCID) 20 MG tablet, Take 1 tablet (20 mg total) by mouth 2 (two) times daily., Disp: 60 tablet, Rfl: 5   fluticasone (FLONASE) 50 MCG/ACT nasal spray, Place 1 spray into both nostrils 2 (two) times daily., Disp: 16 g, Rfl: 5   lisinopril-hydrochlorothiazide (ZESTORETIC) 20-25 MG tablet, Take 1 tablet by mouth daily., Disp: 90 tablet, Rfl: 1   nabumetone (RELAFEN) 500 MG tablet, TAKE 1/2 TO 1 (ONE-HALF TO ONE) TABLET BY MOUTH TWICE DAILY AS NEEDED, Disp: 60 tablet, Rfl: 0   omeprazole (PRILOSEC) 20 MG capsule, Take 1 capsule (20 mg total) by mouth 2 (two) times daily before a meal., Disp: 60 capsule, Rfl: 1   potassium chloride SA (KLOR-CON) 20 MEQ tablet, Take 1 tablet (20 mEq total) by mouth 2 (two) times  daily., Disp: 180 tablet, Rfl: 1   Vitamin D, Ergocalciferol, (DRISDOL) 1.25 MG (50000 UNIT) CAPS capsule, Take 1 capsule (50,000 Units total) by mouth once a week., Disp: 12 capsule, Rfl: 0  Social History   Tobacco Use  Smoking Status Never  Smokeless Tobacco Never    No Known Allergies Objective:  There were no vitals filed for this visit. There is no height or weight on file to calculate BMI. Constitutional Well developed. Well nourished.  Vascular Dorsalis pedis pulses palpable bilaterally. Posterior tibial pulses palpable bilaterally. Capillary refill normal to all digits.  No cyanosis or clubbing noted. Pedal hair growth normal.  Neurologic Normal speech. Oriented to person, place, and time. Epicritic sensation to light touch grossly present bilaterally.  Dermatologic Nail is well adhered to the underlying nailbed I do not see any signs of purulent drainage.  Mild contusion noted.  Less than 5% of hematoma present.  No purulent drainage noted no other clinical signs of infection noted.  Orthopedic: Normal joint ROM without pain or crepitus bilaterally. No visible deformities. No bony tenderness.   Radiographs: None Assessment:   1. Contusion of right great toe without damage to nail, initial encounter    Plan:  Patient was evaluated and treated and all questions  answered.  Right hallux nail contusion -I explained the patient the etiology of contusion and various treatment options were discussed.  Given that this is very mild in nature I discussed with the patient that she would not benefit from a nail avulsion.  I would also advise her to keep the nail intact as it does not appear to be detached from the underlying nailbed.  At this time I discussed with her to continue clinical monitoring it.  I do not see any signs of infection as well.  Patient states understanding and will continue to monitor  No follow-ups on file.

## 2021-08-19 ENCOUNTER — Ambulatory Visit: Payer: 59

## 2021-08-21 ENCOUNTER — Encounter: Payer: Self-pay | Admitting: Nurse Practitioner

## 2021-08-21 ENCOUNTER — Ambulatory Visit (INDEPENDENT_AMBULATORY_CARE_PROVIDER_SITE_OTHER): Payer: 59 | Admitting: Nurse Practitioner

## 2021-08-21 ENCOUNTER — Other Ambulatory Visit: Payer: Self-pay

## 2021-08-21 VITALS — BP 158/92 | HR 71 | Temp 97.0°F | Wt 244.4 lb

## 2021-08-21 DIAGNOSIS — F411 Generalized anxiety disorder: Secondary | ICD-10-CM

## 2021-08-21 DIAGNOSIS — R739 Hyperglycemia, unspecified: Secondary | ICD-10-CM

## 2021-08-21 DIAGNOSIS — I10 Essential (primary) hypertension: Secondary | ICD-10-CM | POA: Diagnosis not present

## 2021-08-21 DIAGNOSIS — E559 Vitamin D deficiency, unspecified: Secondary | ICD-10-CM

## 2021-08-21 DIAGNOSIS — F4322 Adjustment disorder with anxiety: Secondary | ICD-10-CM

## 2021-08-21 LAB — POCT GLYCOSYLATED HEMOGLOBIN (HGB A1C): Hemoglobin A1C: 6 % — AB (ref 4.0–5.6)

## 2021-08-21 MED ORDER — ESCITALOPRAM OXALATE 10 MG PO TABS: 10.0000 mg | ORAL_TABLET | Freq: Every day | ORAL | 1 refills | Status: DC

## 2021-08-21 MED ORDER — LISINOPRIL-HYDROCHLOROTHIAZIDE 20-25 MG PO TABS: 1.0000 | ORAL_TABLET | Freq: Every day | ORAL | 1 refills | Status: DC

## 2021-08-21 NOTE — Progress Notes (Signed)
Subjective:  Patient ID: Dominique Burton, female    DOB: [birth year]  Age: 55 y.o. MRN: [id number]  CC: Follow-up (3 month f/u HTN and patient would like blood work done. /Pt is not fasting. /Declines vaccines today. )  HPI  Essential hypertension Elevated BP today. States she did not take medication today BP Readings from Last 3 Encounters:  08/21/21 (!) 158/92  04/11/21 126/84  04/07/21 (!) 166/96   Maintain current med dose. Monitor BP at home each AM Call office if BP persistently >140/80  Hyperglycemia Stable hgbA1c at 6.0% Start daily exercise and maintain heart healthy diet.  GAD (generalized anxiety disorder) Reports improved mood with lexapro 5mg  x 57months Denies any adverse side effects.  Increase lexapro to 10mg  daily after another 30days.   Reviewed past Medical, Social and Family history today.  Outpatient Medications Prior to Visit  Medication Sig Dispense Refill   cetirizine (ZYRTEC) 10 MG tablet Take 1 tablet (10 mg total) by mouth daily. 30 tablet 5   famotidine (PEPCID) 20 MG tablet Take 1 tablet (20 mg total) by mouth 2 (two) times daily. 60 tablet 5   fluticasone (FLONASE) 50 MCG/ACT nasal spray Place 1 spray into both nostrils 2 (two) times daily. 16 g 5   omeprazole (PRILOSEC) 20 MG capsule Take 1 capsule (20 mg total) by mouth 2 (two) times daily before a meal. 60 capsule 1   potassium chloride SA (KLOR-CON) 20 MEQ tablet Take 1 tablet (20 mEq total) by mouth 2 (two) times daily. 180 tablet 1   Vitamin D, Ergocalciferol, (DRISDOL) 1.25 MG (50000 UNIT) CAPS capsule Take 1 capsule (50,000 Units total) by mouth once a week. 12 capsule 0   escitalopram (LEXAPRO) 5 MG tablet Take 1 tablet (5 mg total) by mouth at bedtime. 90 tablet 1   lisinopril-hydrochlorothiazide (ZESTORETIC) 20-25 MG tablet Take 1 tablet by mouth daily. 90 tablet 1   nabumetone (RELAFEN) 500 MG tablet TAKE 1/2 TO 1 (ONE-HALF TO ONE) TABLET BY MOUTH TWICE DAILY AS NEEDED (Patient not  taking: Reported on 08/21/2021) 60 tablet 0   No facility-administered medications prior to visit.    ROS See HPI  Objective:  BP (!) 158/92 (BP Location: Left Arm, Patient Position: Sitting, Cuff Size: Large)   Pulse 71   Temp (!) 97 F (36.1 C) (Temporal)   Wt 244 lb 6.4 oz (110.9 kg)   SpO2 97%   BMI 39.45 kg/m   Physical Exam Vitals reviewed.  Constitutional:      General: She is not in acute distress.    Appearance: She is obese.  HENT:     Mouth/Throat:     Pharynx: No oropharyngeal exudate.  Eyes:     General: No scleral icterus. Cardiovascular:     Rate and Rhythm: Normal rate and regular rhythm.     Pulses: Normal pulses.     Heart sounds: Normal heart sounds.  Pulmonary:     Effort: Pulmonary effort is normal.     Breath sounds: Normal breath sounds.  Abdominal:     General: There is distension.  Musculoskeletal:     Cervical back: Normal range of motion and neck supple.     Right lower leg: No edema.     Left lower leg: No edema.  Neurological:     Mental Status: She is alert and oriented to person, place, and time.  Psychiatric:        Mood and Affect: Mood normal.  Behavior: Behavior normal.        Thought Content: Thought content normal.    Assessment & Plan:  This visit occurred during the SARS-CoV-2 public health emergency.  Safety protocols were in place, including screening questions prior to the visit, additional usage of staff PPE, and extensive cleaning of exam room while observing appropriate contact time as indicated for disinfecting solutions.   Dominique Burton was seen today for follow-up.  Diagnoses and all orders for this visit:  Essential hypertension -     lisinopril-hydrochlorothiazide (ZESTORETIC) 20-25 MG tablet; Take 1 tablet by mouth daily.  Hyperglycemia -     POCT glycosylated hemoglobin (Hb A1C)  GAD (generalized anxiety disorder) -     escitalopram (LEXAPRO) 10 MG tablet; Take 1 tablet (10 mg total) by mouth at  bedtime.   Problem List Items Addressed This Visit       Cardiovascular and Mediastinum   Essential hypertension - Primary    Elevated BP today. States she did not take medication today BP Readings from Last 3 Encounters:  08/21/21 (!) 158/92  04/11/21 126/84  04/07/21 (!) 166/96   Maintain current med dose. Monitor BP at home each AM Call office if BP persistently >140/80      Relevant Medications   lisinopril-hydrochlorothiazide (ZESTORETIC) 20-25 MG tablet     Other   GAD (generalized anxiety disorder)    Reports improved mood with lexapro 5mg  x 73months Denies any adverse side effects.  Increase lexapro to 10mg  daily after another 30days.      Relevant Medications   escitalopram (LEXAPRO) 10 MG tablet   Hyperglycemia    Stable hgbA1c at 6.0% Start daily exercise and maintain heart healthy diet.      Relevant Orders   POCT glycosylated hemoglobin (Hb A1C) (Completed)    Follow-up: Return in about 3 months (around 11/21/2021) for CPE (fasting).  , NP

## 2021-08-21 NOTE — Patient Instructions (Addendum)
Call office if BP persistently >140/80.  Increase lexapro to 10mg  daily after another 30days.  Stable hgbA1c at 6.0% Start daily exercise and maintain heart healthy diet.  How to Increase Your Level of Physical Activity Getting regular physical activity is important for your overall health and well-being. Most people do not get enough exercise. There are easy ways to increase your level of physical activity, even if you have not been very active in the past or if you are just starting out. What are the benefits of physical activity? Physical activity has many short-term and long-term benefits. Being active on a regular basis can improve your physical and mental health as well as provide other benefits. Physical health benefits Helping you lose weight or maintain a healthy weight. Strengthening your muscles and bones. Reducing your risk of certain long-term (chronic) diseases, including heart disease, cancer, and diabetes. Being able to move around more easily and for longer periods of time without getting tired (increased endurance or stamina). Improving your ability to fight off illness (enhanced immunity). Being able to sleep better. Helping you stay healthy as you get older, including: Helping you stay mobile, or capable of walking and moving around. Preventing accidents, such as falls. Increasing life expectancy. Mental health benefits Boosting your mood and improving your self-esteem. Lowering your chance of having mental health problems, such as depression or anxiety. Helping you feel good about your body. Other benefits Finding new sources of fun and enjoyment. Meeting new people who share a common interest. Before you begin If you have a chronic illness or have not been active for a while, check with your health care provider about how to get started. Ask your health care provider what activities are safe for you. Start out slowly. Walking or doing some simple chair exercises  is a good place to start, especially if you have not been active before or for a long time. Set goals that you can work toward. Ask your health care provider how much exercise is best for you. In general, most adults should: Do moderate-intensity exercise for at least 150 minutes each week (30 minutes on most days of the week) or vigorous exercise for at least 75 minutes each week, or a combination of these. Moderate-intensity exercise can include walking at a quick pace, biking, yoga, water aerobics, or gardening. Vigorous exercise involves activities that take more effort, such as jogging or running, playing sports, swimming laps, or jumping rope. Do strength exercises on at least 2 days each week. This can include weight lifting, body weight exercises, and resistance-band exercises. How to be more physically active Make a plan  Try to find activities that you enjoy. You are more likely to commit to an exercise routine if it does not feel like a chore. If you have bone or joint problems, choose low-impact exercises, like walking or swimming. Use these tips for being successful with an exercise plan: Find a workout partner for accountability. Join a group or class, such as an aerobics class, cycling class, or sports team. Make family time active. Go for a walk, bike, or swim. Include a variety of exercises each week. Consider using a fitness tracker, such as a mobile phone app or a device worn like a watch, that will count the number of steps you take each day. Many people strive to reach 10,000 steps a day. Find ways to be active in your daily routines Besides your formal exercise plans, you can find ways to do physical activity during  your daily routines, such as: Walking or biking to work or to the store. Taking the stairs instead of the elevator. Parking farther away from the door at work or at the store. Planning walking meetings. Walking around while you are on the phone. Where to find  more information Centers for Disease Control and Prevention: [employer].pt [employer] Council on Fitness, Sports & Nutrition: www.fitness.gov ChooseMyPlate: http://www.harvey.com/ Contact a health care provider if: You have headaches, muscle aches, or joint pain that is concerning. You feel dizzy or light-headed while exercising. You faint. You feel your heart skipping, racing, or fluttering. You have chest pain while exercising. Summary Exercise benefits your mind and body at any age, even if you are just starting out. If you have a chronic illness or have not been active for a while, check with your health care provider before increasing your physical activity. Choose activities that are safe and enjoyable for you. Ask your health care provider what activities are safe for you. Start slowly. Tell your health care provider if you have problems as you start to increase your activity level. This information is not intended to replace advice given to you by your health care provider. Make sure you discuss any questions you have with your health care provider. Document Revised: 02/21/2021 Document Reviewed: 02/21/2021 Elsevier Patient Education  2022 [employer].

## 2021-08-22 DIAGNOSIS — F411 Generalized anxiety disorder: Secondary | ICD-10-CM | POA: Insufficient documentation

## 2021-08-22 NOTE — Assessment & Plan Note (Signed)
Stable hgbA1c at 6.0% Start daily exercise and maintain heart healthy diet.

## 2021-08-22 NOTE — Assessment & Plan Note (Signed)
Elevated BP today. States she did not take medication today BP Readings from Last 3 Encounters:  08/21/21 (!) 158/92  04/11/21 126/84  04/07/21 (!) 166/96   Maintain current med dose. Monitor BP at home each AM Call office if BP persistently >140/80

## 2021-08-22 NOTE — Assessment & Plan Note (Signed)
Reports improved mood with lexapro 5mg  x 41months Denies any adverse side effects.  Increase lexapro to 10mg  daily after another 30days.

## 2021-09-15 ENCOUNTER — Telehealth: Payer: Self-pay | Admitting: Physical Medicine and Rehabilitation

## 2021-09-15 NOTE — Telephone Encounter (Signed)
Patient previously saw Dr. Prince Rome for left knee pain. Please advise.

## 2021-09-15 NOTE — Telephone Encounter (Signed)
Pt called and would like to schedule an appt with newton about ehr knee. Has never seen newton before.   CB [phone]

## 2021-09-16 ENCOUNTER — Ambulatory Visit: Payer: Self-pay

## 2021-09-16 ENCOUNTER — Other Ambulatory Visit: Payer: Self-pay

## 2021-09-16 ENCOUNTER — Encounter: Payer: Self-pay | Admitting: Orthopaedic Surgery

## 2021-09-16 ENCOUNTER — Ambulatory Visit (INDEPENDENT_AMBULATORY_CARE_PROVIDER_SITE_OTHER): Payer: 59 | Admitting: Orthopaedic Surgery

## 2021-09-16 VITALS — Ht 66.0 in | Wt 240.0 lb

## 2021-09-16 DIAGNOSIS — M25561 Pain in right knee: Secondary | ICD-10-CM

## 2021-09-16 DIAGNOSIS — M25562 Pain in left knee: Secondary | ICD-10-CM | POA: Diagnosis not present

## 2021-09-16 DIAGNOSIS — G8929 Other chronic pain: Secondary | ICD-10-CM

## 2021-09-16 MED ORDER — BUPIVACAINE HCL 0.5 % IJ SOLN
2.0000 mL | INTRAMUSCULAR | Status: AC | PRN
Start: 2021-09-16 — End: 2021-09-16
  Administered 2021-09-16: 2 mL via INTRA_ARTICULAR

## 2021-09-16 MED ORDER — LIDOCAINE HCL 1 % IJ SOLN
2.0000 mL | INTRAMUSCULAR | Status: AC | PRN
Start: 2021-09-16 — End: 2021-09-16
  Administered 2021-09-16: 2 mL

## 2021-09-16 MED ORDER — METHYLPREDNISOLONE ACETATE 40 MG/ML IJ SUSP
40.0000 mg | INTRAMUSCULAR | Status: AC | PRN
Start: 2021-09-16 — End: 2021-09-16
  Administered 2021-09-16: 40 mg via INTRA_ARTICULAR

## 2021-09-16 NOTE — Telephone Encounter (Signed)
Left message to advise that patient call the front desk to schedule an appointment with ortho.

## 2021-09-16 NOTE — Progress Notes (Signed)
Office Visit Note   Patient: Dominique Burton           Date of Birth: 04/19/1966           MRN: [id number] Visit Date: 09/16/2021              Requested by: Anne Ng, NP [address] [place],  [place] [address] PCP: Anne Ng, NP   Assessment & Plan: Visit Diagnoses:  1. Chronic pain of both knees     Plan: Impression is right knee DJD with small effusion.  I aspirated 5 cc of synovial fluid and then injected cortisone.  She tolerated this well.  I also counseled her on the importance of weight loss and how this affects degenerative joint disease and the pain.  Follow-Up Instructions: No follow-ups on file.   Orders:  Orders Placed This Encounter  Procedures   XR KNEE 3 VIEW LEFT   XR KNEE 3 VIEW RIGHT   No orders of the defined types were placed in this encounter.     Procedures: Large Joint Inj: R knee on 09/16/2021 10:25 AM Indications: pain Details: 22 G needle  Arthrogram: No  Medications: 40 mg methylPREDNISolone acetate 40 MG/ML; 2 mL lidocaine 1 %; 2 mL bupivacaine 0.5 % Consent was given by the patient. Patient was prepped and draped in the usual sterile fashion.      Clinical Data: No additional findings.   Subjective: Chief Complaint  Patient presents with   Right Knee - Pain   Left Knee - Pain    Dominique Burton is a 55 year old female prior patient of Dr. Prince Rome who comes in for evaluation of bilateral knee pain mainly in the right knee.  This has been causing her pain for a couple months.  Denies any injuries.  Denies any grinding or giving way.  She has had a cortisone injection to the left knee in the past with good relief.  She is hoping that she is a candidate for an injection to the right knee.  Reports some slight swelling.   Review of Systems  Constitutional: Negative.   HENT: Negative.    Eyes: Negative.   Respiratory: Negative.    Cardiovascular: Negative.   Endocrine: Negative.   Musculoskeletal: Negative.    Neurological: Negative.   Hematological: Negative.   Psychiatric/Behavioral: Negative.    All other systems reviewed and are negative.   Objective: Vital Signs: Ht 5\' 6"  (1.676 m)   Wt 240 lb (108.9 kg)   BMI 38.74 kg/m   Physical Exam Vitals and nursing note reviewed.  Constitutional:      Appearance: She is well-developed.  Pulmonary:     Effort: Pulmonary effort is normal.  Skin:    General: Skin is warm.     Capillary Refill: Capillary refill takes less than 2 seconds.  Neurological:     Mental Status: She is alert and oriented to person, place, and time.  Psychiatric:        Behavior: Behavior normal.        Thought Content: Thought content normal.        Judgment: Judgment normal.    Ortho Exam  Right knee shows a small effusion.  Mild crepitus with arc of motion.  Collateral cruciates are stable.  No joint line tenderness.  Specialty Comments:  No specialty comments available.  Imaging: XR KNEE 3 VIEW LEFT  Result Date: 09/16/2021 Moderate tricompartmental DJD with moderate joint space narrowing.  XR KNEE 3 VIEW  RIGHT  Result Date: 09/16/2021 Moderate tricompartmental DJD.    PMFS History: Patient Active Problem List   Diagnosis Date Noted   GAD (generalized anxiety disorder) 08/22/2021   Hyperglycemia 01/02/2021   Class 2 severe obesity due to excess calories with serious comorbidity and body mass index (BMI) of 39.0 to 39.9 in adult (HCC) 01/02/2021   Essential hypertension 01/11/2018   Vitamin D deficiency 01/11/2018   Hypokalemia 01/11/2018   Past Medical History:  Diagnosis Date   Hypertension    Vitamin D deficiency     Family History  Problem Relation Age of Onset   Hypertension Mother    Diabetes Father    Hypertension Father    Hypertension Sister    Heart disease Brother    Hypertension Brother    Hypertension Maternal Grandmother    Hypertension Maternal Grandfather     Past Surgical History:  Procedure Laterality Date    ABDOMINAL HYSTERECTOMY     PARTIAL   CESAREAN SECTION     Social History   Occupational History   Not on file  Tobacco Use   Smoking status: Never   Smokeless tobacco: Never  Vaping Use   Vaping Use: Never used  Substance and Sexual Activity   Alcohol use: No   Drug use: No   Sexual activity: Yes

## 2021-10-30 ENCOUNTER — Other Ambulatory Visit: Payer: Self-pay | Admitting: Nurse Practitioner

## 2021-10-30 DIAGNOSIS — E876 Hypokalemia: Secondary | ICD-10-CM

## 2021-10-30 DIAGNOSIS — I10 Essential (primary) hypertension: Secondary | ICD-10-CM

## 2021-11-05 ENCOUNTER — Ambulatory Visit: Payer: 59 | Admitting: Family

## 2021-11-07 ENCOUNTER — Other Ambulatory Visit: Payer: Self-pay

## 2021-11-07 ENCOUNTER — Ambulatory Visit (INDEPENDENT_AMBULATORY_CARE_PROVIDER_SITE_OTHER): Payer: 59 | Admitting: Family

## 2021-11-07 ENCOUNTER — Encounter: Payer: Self-pay | Admitting: Family

## 2021-11-07 VITALS — BP 130/88 | HR 70 | Temp 96.7°F | Ht 66.5 in | Wt 241.4 lb

## 2021-11-07 DIAGNOSIS — N390 Urinary tract infection, site not specified: Secondary | ICD-10-CM

## 2021-11-07 DIAGNOSIS — R112 Nausea with vomiting, unspecified: Secondary | ICD-10-CM

## 2021-11-07 DIAGNOSIS — K219 Gastro-esophageal reflux disease without esophagitis: Secondary | ICD-10-CM

## 2021-11-07 DIAGNOSIS — R300 Dysuria: Secondary | ICD-10-CM | POA: Diagnosis not present

## 2021-11-07 DIAGNOSIS — K2100 Gastro-esophageal reflux disease with esophagitis, without bleeding: Secondary | ICD-10-CM

## 2021-11-07 DIAGNOSIS — A499 Bacterial infection, unspecified: Secondary | ICD-10-CM

## 2021-11-07 LAB — POCT URINALYSIS DIPSTICK
Bilirubin, UA: POSITIVE
Glucose, UA: NEGATIVE
Ketones, UA: 5
Nitrite, UA: POSITIVE
Protein, UA: POSITIVE — AB
Spec Grav, UA: 1.025 (ref 1.010–1.025)
Urobilinogen, UA: 4 E.U./dL — AB
pH, UA: 6 (ref 5.0–8.0)

## 2021-11-07 MED ORDER — SULFAMETHOXAZOLE-TRIMETHOPRIM 800-160 MG PO TABS: 1.0000 | ORAL_TABLET | Freq: Two times a day (BID) | ORAL | 0 refills | Status: DC

## 2021-11-07 NOTE — Patient Instructions (Addendum)
-  Alkaline water daily -Omeprazole 20 mg twice daily.  -Reduce your weight and incorporate exercise.

## 2021-11-08 LAB — URINE CULTURE
MICRO NUMBER:: 12813654
SPECIMEN QUALITY:: ADEQUATE

## 2021-11-10 LAB — H. PYLORI BREATH TEST: H. pylori Breath Test: NOT DETECTED

## 2021-11-10 MED ORDER — OMEPRAZOLE 20 MG PO CPDR: 20.0000 mg | DELAYED_RELEASE_CAPSULE | Freq: Two times a day (BID) | ORAL | 1 refills | Status: DC

## 2021-11-10 NOTE — Progress Notes (Signed)
Acute Office Visit  Subjective:    Patient ID: Dominique Burton, female    DOB: 1965/12/02, 56 y.o.   MRN: [id number]  Chief Complaint  Patient presents with   Acute Visit    Pt states she has been experiencing some issues with GERD for years but it has been worse in the past 3-4 days. Pt states she has been taking Pepcid but due to what she has been eating recently it is not helping with the stomach issues. Pt states she has also noticed a change in her urine color (clear to yellow), but denies burning, tingling, or increase in urine frequency.     HPI Patient is in today with c/o GERD that has been present for years but has been worse 3-4 days. She has been taking Pepcid that is helping some but is not controlling the reflux. She believes it was triggered by cheese bites. She reports having an episode of vomiting. She admits to fried foods, not following a GERD diet and has not lost any weight. She needs a refill of Omeprazole  Has not been on her blood pressure on 3 days.   She is also concerned that her urine is dark yellow. She denies any burning, abdominal pain or back pain.   Past Medical History:  Diagnosis Date   Hypertension    Vitamin D deficiency     Past Surgical History:  Procedure Laterality Date   ABDOMINAL HYSTERECTOMY     PARTIAL   CESAREAN SECTION      Family History  Problem Relation Age of Onset   Hypertension Mother    Diabetes Father    Hypertension Father    Hypertension Sister    Heart disease Brother    Hypertension Brother    Hypertension Maternal Grandmother    Hypertension Maternal Grandfather     Social History   Socioeconomic History   Marital status: Married    Spouse name: Not on file   Number of children: Not on file   Years of education: Not on file   Highest education level: Not on file  Occupational History   Not on file  Tobacco Use   Smoking status: Never   Smokeless tobacco: Never  Vaping Use   Vaping  Use: Never used  Substance and Sexual Activity   Alcohol use: No   Drug use: No   Sexual activity: Yes  Other Topics Concern   Not on file  Social History Narrative   Not on file   Social Determinants of Health   Financial Resource Strain: Not on file  Food Insecurity: Not on file  Transportation Needs: Not on file  Physical Activity: Not on file  Stress: Not on file  Social Connections: Not on file  Intimate Partner Violence: Not on file    Outpatient Medications Prior to Visit  Medication Sig Dispense Refill   cetirizine (ZYRTEC) 10 MG tablet Take 1 tablet (10 mg total) by mouth daily. 30 tablet 5   escitalopram (LEXAPRO) 10 MG tablet Take 1 tablet (10 mg total) by mouth at bedtime. 90 tablet 1   famotidine (PEPCID) 20 MG tablet Take 1 tablet (20 mg total) by mouth 2 (two) times daily. 60 tablet 5   fluticasone (FLONASE) 50 MCG/ACT nasal spray Place 1 spray into both nostrils 2 (two) times daily. 16 g 5   KLOR-CON M20 20 MEQ tablet Take 1 tablet by mouth twice daily 60 tablet 0   lisinopril-hydrochlorothiazide (ZESTORETIC) 20-25 MG tablet Take  1 tablet by mouth daily. 90 tablet 1   Vitamin D, Ergocalciferol, (DRISDOL) 1.25 MG (50000 UNIT) CAPS capsule Take 1 capsule (50,000 Units total) by mouth once a week. 12 capsule 0   omeprazole (PRILOSEC) 20 MG capsule Take 1 capsule (20 mg total) by mouth 2 (two) times daily before a meal. (Patient not taking: Reported on 11/07/2021) 60 capsule 1   No facility-administered medications prior to visit.    No Known Allergies  Review of Systems  Constitutional: Negative.   Respiratory: Negative.    Cardiovascular: Negative.   Gastrointestinal:  Positive for abdominal pain, nausea and vomiting. Negative for diarrhea.  Musculoskeletal: Negative.   Skin: Negative.   Neurological: Negative.   Psychiatric/Behavioral: Negative.    All other systems reviewed and are negative.     Objective:    Physical Exam Vitals and  nursing note reviewed.  Constitutional:      Appearance: Normal appearance.  HENT:     Mouth/Throat:     Mouth: Mucous membranes are moist.  Cardiovascular:     Rate and Rhythm: Normal rate and regular rhythm.  Pulmonary:     Effort: Pulmonary effort is normal.     Breath sounds: Normal breath sounds.  Abdominal:     General: Abdomen is flat.  Musculoskeletal:        General: Normal range of motion.     Cervical back: Normal range of motion and neck supple.  Skin:    General: Skin is warm and dry.  Neurological:     General: No focal deficit present.     Mental Status: She is alert and oriented to person, place, and time.  Psychiatric:        Mood and Affect: Mood normal.        Behavior: Behavior normal.   BP 130/88 (BP Location: Left Arm, Patient Position: Sitting, Cuff Size: Large)    Pulse 70    Temp (!) 96.7 F (35.9 C) (Temporal)    Ht 5' 6.5" (1.689 m)    Wt 241 lb 6.4 oz (109.5 kg)    SpO2 99%    BMI 38.38 kg/m  Wt Readings from Last 3 Encounters:  11/07/21 241 lb 6.4 oz (109.5 kg)  09/16/21 240 lb (108.9 kg)  08/21/21 244 lb 6.4 oz (110.9 kg)    Health Maintenance Due  Topic Date Due   Pneumococcal Vaccine 53-55 Years old (1 - PCV) Never done   HIV Screening  Never done   Hepatitis C Screening  Never done   TETANUS/TDAP  Never done   Zoster Vaccines- Shingrix (1 of 2) Never done   PAP SMEAR-Modifier  04/02/2020   COVID-19 Vaccine (4 - Booster for Pfizer series) 01/13/2021   INFLUENZA VACCINE  06/09/2021    There are no preventive care reminders to display for this patient.   Lab Results  Component Value Date   TSH 1.76 04/11/2021   Lab Results  Component Value Date   WBC 7.4 01/19/2020   HGB 13.7 01/19/2020   HCT 40.8 01/19/2020   MCV 88 01/19/2020   PLT 204 01/19/2020   Lab Results  Component Value Date   NA 141 04/11/2021   K 3.6 04/11/2021   CO2 31 04/11/2021   GLUCOSE 89 04/11/2021   BUN 10 04/11/2021   CREATININE 0.76  04/11/2021   BILITOT 0.4 08/12/2020   ALKPHOS 97 08/12/2020   AST 16 08/12/2020   ALT 14 08/12/2020   PROT 7.2 08/12/2020   ALBUMIN 4.5  08/12/2020   CALCIUM 9.4 04/11/2021   ANIONGAP 6 02/05/2017   GFR 87.75 04/11/2021   Lab Results  Component Value Date   CHOL 146 04/11/2021   Lab Results  Component Value Date   HDL 50.30 04/11/2021   Lab Results  Component Value Date   LDLCALC 78 04/11/2021   Lab Results  Component Value Date   TRIG 89.0 04/11/2021   Lab Results  Component Value Date   CHOLHDL 3 04/11/2021   Lab Results  Component Value Date   HGBA1C 6.0 (A) 08/21/2021       Assessment & Plan:   Gunnar Fusiaula was seen today for acute visit.  Diagnoses and all orders for this visit:  Dysuria -     Urine Culture  Gastroesophageal reflux disease without esophagitis -     POC Urinalysis Dipstick -     H. pylori breath test  Nausea and vomiting, unspecified vomiting type  Bacterial urinary tract infection  Gastroesophageal reflux disease with esophagitis without hemorrhage -     omeprazole (PRILOSEC) 20 MG capsule; Take 1 capsule (20 mg total) by mouth 2 (two) times daily before a meal.  Other orders -     sulfamethoxazole-trimethoprim (BACTRIM DS) 800-160 MG tablet; Take 1 tablet by mouth 2 (two) times daily.    Call the office with any questions or concerns. Incorporate alkaline water into diet. GERD friendly diet. Call the office if symptoms worsen or persist. Recheck as scheduled and sooner as needed.    Eulis FosterPadonda B Izora Benn, FNP

## 2021-11-21 ENCOUNTER — Ambulatory Visit (INDEPENDENT_AMBULATORY_CARE_PROVIDER_SITE_OTHER): Payer: Commercial Managed Care - PPO | Admitting: Nurse Practitioner

## 2021-11-21 ENCOUNTER — Encounter: Payer: Self-pay | Admitting: Nurse Practitioner

## 2021-11-21 ENCOUNTER — Other Ambulatory Visit: Payer: Self-pay

## 2021-11-21 VITALS — BP 140/84 | HR 66 | Temp 97.2°F | Ht 66.5 in | Wt 244.6 lb

## 2021-11-21 DIAGNOSIS — R739 Hyperglycemia, unspecified: Secondary | ICD-10-CM

## 2021-11-21 DIAGNOSIS — I10 Essential (primary) hypertension: Secondary | ICD-10-CM | POA: Diagnosis not present

## 2021-11-21 DIAGNOSIS — E559 Vitamin D deficiency, unspecified: Secondary | ICD-10-CM

## 2021-11-21 DIAGNOSIS — K219 Gastro-esophageal reflux disease without esophagitis: Secondary | ICD-10-CM

## 2021-11-21 DIAGNOSIS — F411 Generalized anxiety disorder: Secondary | ICD-10-CM

## 2021-11-21 DIAGNOSIS — E876 Hypokalemia: Secondary | ICD-10-CM

## 2021-11-21 LAB — CBC
HCT: 40.2 % (ref 36.0–46.0)
Hemoglobin: 12.9 g/dL (ref 12.0–15.0)
MCHC: 32.2 g/dL (ref 30.0–36.0)
MCV: 88.5 fl (ref 78.0–100.0)
Platelets: 300 10*3/uL (ref 150.0–400.0)
RBC: 4.54 Mil/uL (ref 3.87–5.11)
RDW: 14.7 % (ref 11.5–15.5)
WBC: 5.9 10*3/uL (ref 4.0–10.5)

## 2021-11-21 LAB — BASIC METABOLIC PANEL
BUN: 11 mg/dL (ref 6–23)
CO2: 30 mEq/L (ref 19–32)
Calcium: 9.3 mg/dL (ref 8.4–10.5)
Chloride: 103 mEq/L (ref 96–112)
Creatinine, Ser: 0.76 mg/dL (ref 0.40–1.20)
GFR: 87.37 mL/min (ref >60.00)
Glucose, Bld: 102 mg/dL — ABNORMAL HIGH (ref 70–99)
Potassium: 3.6 mEq/L (ref 3.5–5.1)
Sodium: 142 mEq/L (ref 135–145)

## 2021-11-21 LAB — HEMOGLOBIN A1C: Hgb A1c MFr Bld: 6.2 % (ref 4.6–6.5)

## 2021-11-21 LAB — VITAMIN D 25 HYDROXY (VIT D DEFICIENCY, FRACTURES): VITD: 18.1 ng/mL — ABNORMAL LOW (ref 30.00–100.00)

## 2021-11-21 MED ORDER — KLOR-CON M20 20 MEQ PO TBCR: 20.0000 meq | EXTENDED_RELEASE_TABLET | Freq: Every day | ORAL | 1 refills | Status: DC

## 2021-11-21 MED ORDER — LISINOPRIL-HYDROCHLOROTHIAZIDE 20-25 MG PO TABS: 1.0000 | ORAL_TABLET | Freq: Every day | ORAL | 1 refills | Status: DC

## 2021-11-21 MED ORDER — ESCITALOPRAM OXALATE 10 MG PO TABS: 10.0000 mg | ORAL_TABLET | Freq: Every day | ORAL | 3 refills | Status: DC

## 2021-11-21 MED ORDER — VITAMIN D (ERGOCALCIFEROL) 1.25 MG (50000 UNIT) PO CAPS: ORAL_CAPSULE | ORAL | 0 refills | Status: DC

## 2021-11-21 MED ORDER — OMEPRAZOLE 20 MG PO CPDR: 20.0000 mg | DELAYED_RELEASE_CAPSULE | Freq: Every day | ORAL | 0 refills | Status: DC

## 2021-11-21 NOTE — Assessment & Plan Note (Signed)
Did not take BP med this morning. Home BP reading 120s/70s BP Readings from Last 3 Encounters:  11/21/21 140/84  11/07/21 130/88  08/21/21 (!) 158/92   Repeat BMP Maintain  Med dose at this time F/up in 5months

## 2021-11-21 NOTE — Assessment & Plan Note (Addendum)
Repeat HgbA1c: Stable renal function and cbc hgbA1c at 6.2% from 6.0%. maintain low carb and low sugar diet. Continue daily exercise.

## 2021-11-21 NOTE — Assessment & Plan Note (Signed)
Improved and stable with lexapro Maintain med dose

## 2021-11-21 NOTE — Assessment & Plan Note (Addendum)
Repeat vitamin D: Persistent low vitamin D: take 50000IU 2x/week x 4weeks, then once a week x 6weeks. Then switch to 2000IU from over the counter.

## 2021-11-21 NOTE — Progress Notes (Signed)
Subjective:  Patient ID: Dominique Burton, female    DOB: [birth year]  Age: 56 y.o. MRN: [id number]  CC: Follow-up (3 month f/u on HTN and GAD. )  HPI  Essential hypertension Did not take BP med this morning. Home BP reading 120s/70s BP Readings from Last 3 Encounters:  11/21/21 140/84  11/07/21 130/88  08/21/21 (!) 158/92   Repeat BMP Maintain  Med dose at this time F/up in 43months  Vitamin D deficiency Repeat vitamin D: Persistent low vitamin D: take 50000IU 2x/week x 4weeks, then once a week x 6weeks. Then switch to 2000IU from over the counter.  Hyperglycemia Repeat HgbA1c: Stable renal function and cbc hgbA1c at 6.2% from 6.0%. maintain low carb and low sugar diet. Continue daily exercise.   GAD (generalized anxiety disorder) Improved and stable with lexapro Maintain med dose  BP Readings from Last 3 Encounters:  11/21/21 140/84  11/07/21 130/88  08/21/21 (!) 158/92    Reviewed past Medical, Social and Family history today.  Outpatient Medications Prior to Visit  Medication Sig Dispense Refill   cetirizine (ZYRTEC) 10 MG tablet Take 1 tablet (10 mg total) by mouth daily. 30 tablet 5   fluticasone (FLONASE) 50 MCG/ACT nasal spray Place 1 spray into both nostrils 2 (two) times daily. 16 g 5   escitalopram (LEXAPRO) 10 MG tablet Take 1 tablet (10 mg total) by mouth at bedtime. 90 tablet 1   famotidine (PEPCID) 20 MG tablet Take 1 tablet (20 mg total) by mouth 2 (two) times daily. 60 tablet 5   KLOR-CON M20 20 MEQ tablet Take 1 tablet by mouth twice daily 60 tablet 0   lisinopril-hydrochlorothiazide (ZESTORETIC) 20-25 MG tablet Take 1 tablet by mouth daily. 90 tablet 1   omeprazole (PRILOSEC) 20 MG capsule Take 1 capsule (20 mg total) by mouth 2 (two) times daily before a meal. 60 capsule 1   sulfamethoxazole-trimethoprim (BACTRIM DS) 800-160 MG tablet Take 1 tablet by mouth 2 (two) times daily. 14 tablet 0   Vitamin D, Ergocalciferol, (DRISDOL) 1.25 MG (50000 UNIT) CAPS  capsule Take 1 capsule (50,000 Units total) by mouth once a week. 12 capsule 0   No facility-administered medications prior to visit.    ROS See HPI  Objective:  BP 140/84 (BP Location: Left Arm, Patient Position: Sitting, Cuff Size: Large)    Pulse 66    Temp (!) 97.2 F (36.2 C) (Temporal)    Ht 5' 6.5" (1.689 m)    Wt 244 lb 9.6 oz (110.9 kg)    SpO2 97%    BMI 38.89 kg/m   Physical Exam Cardiovascular:     Rate and Rhythm: Normal rate and regular rhythm.     Pulses: Normal pulses.     Heart sounds: Normal heart sounds.  Pulmonary:     Effort: Pulmonary effort is normal.     Breath sounds: Normal breath sounds.  Musculoskeletal:     Right lower leg: No edema.     Left lower leg: No edema.  Neurological:     Mental Status: She is alert and oriented to person, place, and time.   Assessment & Plan:  This visit occurred during the SARS-CoV-2 public health emergency.  Safety protocols were in place, including screening questions prior to the visit, additional usage of staff PPE, and extensive cleaning of exam room while observing appropriate contact time as indicated for disinfecting solutions.   Dominique Burton was seen today for follow-up.  Diagnoses and all orders for this visit:  Essential hypertension -     Basic metabolic panel -     CBC -     lisinopril-hydrochlorothiazide (ZESTORETIC) 20-25 MG tablet; Take 1 tablet by mouth daily. -     KLOR-CON M20 20 MEQ tablet; Take 1 tablet (20 mEq total) by mouth daily.  Hyperglycemia -     Hemoglobin A1c  Vitamin D deficiency -     Vitamin D (25 hydroxy) -     Vitamin D, Ergocalciferol, (DRISDOL) 1.25 MG (50000 UNIT) CAPS capsule; 2x/week x 4weeks, then 1x/week x 6weeks  GAD (generalized anxiety disorder) -     escitalopram (LEXAPRO) 10 MG tablet; Take 1 tablet (10 mg total) by mouth at bedtime.  Gastroesophageal reflux disease without esophagitis -     omeprazole (PRILOSEC) 20 MG capsule; Take 1 capsule (20 mg total) by mouth  daily.  Hypokalemia -     KLOR-CON M20 20 MEQ tablet; Take 1 tablet (20 mEq total) by mouth daily.    Problem List Items Addressed This Visit       Cardiovascular and Mediastinum   Essential hypertension - Primary    Did not take BP med this morning. Home BP reading 120s/70s BP Readings from Last 3 Encounters:  11/21/21 140/84  11/07/21 130/88  08/21/21 (!) 158/92   Repeat BMP Maintain  Med dose at this time F/up in 71months      Relevant Medications   lisinopril-hydrochlorothiazide (ZESTORETIC) 20-25 MG tablet   KLOR-CON M20 20 MEQ tablet   Other Relevant Orders   Basic metabolic panel (Completed)   CBC (Completed)     Other   GAD (generalized anxiety disorder)    Improved and stable with lexapro Maintain med dose      Relevant Medications   escitalopram (LEXAPRO) 10 MG tablet   Hyperglycemia    Repeat HgbA1c: Stable renal function and cbc hgbA1c at 6.2% from 6.0%. maintain low carb and low sugar diet. Continue daily exercise.       Relevant Orders   Hemoglobin A1c (Completed)   Hypokalemia   Relevant Medications   KLOR-CON M20 20 MEQ tablet   Vitamin D deficiency    Repeat vitamin D: Persistent low vitamin D: take 50000IU 2x/week x 4weeks, then once a week x 6weeks. Then switch to 2000IU from over the counter.      Relevant Medications   Vitamin D, Ergocalciferol, (DRISDOL) 1.25 MG (50000 UNIT) CAPS capsule   Other Relevant Orders   Vitamin D (25 hydroxy) (Completed)   Other Visit Diagnoses     Gastroesophageal reflux disease without esophagitis       Relevant Medications   omeprazole (PRILOSEC) 20 MG capsule       Follow-up: Return in about 6 months (around 05/21/2022) for CPE (fasting).  Alysia Penna, NP

## 2021-11-21 NOTE — Patient Instructions (Addendum)
Go to lab for blood draw ?Maintain current medications ?

## 2021-12-15 ENCOUNTER — Telehealth: Payer: Self-pay | Admitting: Nurse Practitioner

## 2021-12-15 NOTE — Telephone Encounter (Signed)
Pt called and said that she would like to stay 5mg  of the escitalopram (LEXAPRO) 10 MG tablet instead of the 10mg , and she asked if that could be sent in. Please advise

## 2021-12-22 ENCOUNTER — Encounter: Payer: Self-pay | Admitting: Nurse Practitioner

## 2021-12-22 ENCOUNTER — Telehealth: Payer: Self-pay | Admitting: Nurse Practitioner

## 2021-12-22 ENCOUNTER — Ambulatory Visit (INDEPENDENT_AMBULATORY_CARE_PROVIDER_SITE_OTHER): Payer: Commercial Managed Care - PPO | Admitting: Nurse Practitioner

## 2021-12-22 ENCOUNTER — Ambulatory Visit (HOSPITAL_COMMUNITY)
Admission: RE | Admit: 2021-12-22 | Discharge: 2021-12-22 | Disposition: A | Discharge status: Home / Self Care | Payer: Commercial Managed Care - PPO | Source: Ambulatory Visit | Attending: Nurse Practitioner | Admitting: Nurse Practitioner

## 2021-12-22 ENCOUNTER — Other Ambulatory Visit: Payer: Self-pay

## 2021-12-22 VITALS — BP 138/82 | HR 76 | Temp 97.0°F | Ht 66.5 in | Wt 248.0 lb

## 2021-12-22 DIAGNOSIS — E876 Hypokalemia: Secondary | ICD-10-CM | POA: Diagnosis not present

## 2021-12-22 DIAGNOSIS — F411 Generalized anxiety disorder: Secondary | ICD-10-CM | POA: Diagnosis not present

## 2021-12-22 DIAGNOSIS — I10 Essential (primary) hypertension: Secondary | ICD-10-CM | POA: Diagnosis not present

## 2021-12-22 DIAGNOSIS — R600 Localized edema: Secondary | ICD-10-CM | POA: Diagnosis present

## 2021-12-22 DIAGNOSIS — Z6839 Body mass index (BMI) 39.0-39.9, adult: Secondary | ICD-10-CM

## 2021-12-22 MED ORDER — KLOR-CON M20 20 MEQ PO TBCR: 20.0000 meq | EXTENDED_RELEASE_TABLET | Freq: Two times a day (BID) | ORAL | 1 refills | Status: DC

## 2021-12-22 NOTE — Assessment & Plan Note (Signed)
Reports muscle cramps with HCTZ and KCL daily BMP Latest Ref Rng & Units 11/21/2021 04/11/2021 01/02/2021  Glucose 70 - 99 mg/dL 416(S) 89 063(K)  BUN 6 - 23 mg/dL 11 10 12   Creatinine 0.40 - 1.20 mg/dL 1.60 1.09  BUN/Creat Ratio 9 - 23 - - -  Sodium 135 - 145 mEq/L 142 141 141  Potassium 3.5 - 5.1 mEq/L 3.6 3.6 3.3(L)  Chloride 96 - 112 mEq/L 103 101 102  CO2 19 - 32 mEq/L 30 31 33(H)  Calcium 8.4 - 10.5 mg/dL 9.3 9.4 9.7   BP Readings from Last 3 Encounters:  12/22/21 138/82  11/21/21 140/84  11/07/21 130/88   Maintain lisinopril/HCTZ dose Increase potassium to 11/09/21 BID F/up in 3-30months

## 2021-12-22 NOTE — Telephone Encounter (Signed)
[facility] called, pt is negative for blood clot in right leg.

## 2021-12-22 NOTE — Assessment & Plan Note (Signed)
Reports difficulty with weight loss despite portion control and daily exercise x 85month. Wt Readings from Last 3 Encounters:  12/22/21 248 lb (112.5 kg)  11/21/21 244 lb 9.6 oz (110.9 kg)  11/07/21 241 lb 6.4 oz (109.5 kg)   Entered referral to weight management clinic

## 2021-12-22 NOTE — Assessment & Plan Note (Signed)
She decreased dose to 5mg  and reports mood has been stable. Advised to maintain dose F/up in 3-37months

## 2021-12-22 NOTE — Progress Notes (Signed)
Subjective:  Patient ID: Dominique Burton, female    DOB: 1966/06/25  Age: 56 y.o. MRN: [id number]  CC: Acute Visit (Pt c/o swelling in hands and feet, stating she feels like she is retaining fluid and would like to discuss treatment options. )   Leg Pain  The incident occurred 5 to 7 days ago. There was no injury mechanism. The pain is present in the right leg. The quality of the pain is described as aching. The pain has been Constant since onset. Pertinent negatives include no inability to bear weight, loss of motion, loss of sensation, muscle weakness, numbness or tingling. She reports no foreign bodies present. The symptoms are aggravated by movement and weight bearing. She has tried elevation for the symptoms. The treatment provided no relief.   Class 2 severe obesity due to excess calories with serious comorbidity and body mass index (BMI) of 39.0 to 39.9 in adult [facility]) Reports difficulty with weight loss despite portion control and daily exercise x 46month. Wt Readings from Last 3 Encounters:  12/22/21 248 lb (112.5 kg)  11/21/21 244 lb 9.6 oz (110.9 kg)  11/07/21 241 lb 6.4 oz (109.5 kg)   Entered referral to weight management clinic  Essential hypertension Reports muscle cramps with HCTZ and KCL 63mEq daily BMP Latest Ref Rng & Units 11/21/2021 04/11/2021 01/02/2021  Glucose 70 - 99 mg/dL 102(H) 89 102(H)  BUN 6 - 23 mg/dL 11 10 12   Creatinine 0.40 - 1.20 mg/dL 0.76 0.76 0.79  BUN/Creat Ratio 9 - 23 - - -  Sodium 135 - 145 mEq/L 142 141 141  Potassium 3.5 - 5.1 mEq/L 3.6 3.6 3.3(L)  Chloride 96 - 112 mEq/L 103 101 102  CO2 19 - 32 mEq/L 30 31 33(H)  Calcium 8.4 - 10.5 mg/dL 9.3 9.4 9.7   BP Readings from Last 3 Encounters:  12/22/21 138/82  11/21/21 140/84  11/07/21 130/88   Maintain lisinopril/HCTZ dose Increase potassium to 59mEq BID F/up in 3-48months  GAD (generalized anxiety disorder) She decreased dose to 5mg  and reports mood has been stable. Advised to maintain  dose F/up in 3-47months  Wt Readings from Last 3 Encounters:  12/22/21 248 lb (112.5 kg)  11/21/21 244 lb 9.6 oz (110.9 kg)  11/07/21 241 lb 6.4 oz (109.5 kg)    BP Readings from Last 3 Encounters:  12/22/21 138/82  11/21/21 140/84  11/07/21 130/88     Reviewed past Medical, Social and Family history today.  Outpatient Medications Prior to Visit  Medication Sig Dispense Refill   cetirizine (ZYRTEC) 10 MG tablet Take 1 tablet (10 mg total) by mouth daily. 30 tablet 5   escitalopram (LEXAPRO) 10 MG tablet Take 0.5 tablets (5 mg total) by mouth at bedtime. 45 tablet 3   fluticasone (FLONASE) 50 MCG/ACT nasal spray Place 1 spray into both nostrils 2 (two) times daily. 16 g 5   lisinopril-hydrochlorothiazide (ZESTORETIC) 20-25 MG tablet Take 1 tablet by mouth daily. 90 tablet 1   omeprazole (PRILOSEC) 20 MG capsule Take 1 capsule (20 mg total) by mouth daily. 90 capsule 0   Vitamin D, Ergocalciferol, (DRISDOL) 1.25 MG (50000 UNIT) CAPS capsule 2x/week x 4weeks, then 1x/week x 6weeks 14 capsule 0   escitalopram (LEXAPRO) 10 MG tablet Take 1 tablet (10 mg total) by mouth at bedtime. 90 tablet 3   KLOR-CON M20 20 MEQ tablet Take 1 tablet (20 mEq total) by mouth daily. 90 tablet 1   No facility-administered medications prior to visit.  ROS See HPI  Objective:  BP 138/82 (BP Location: Left Arm, Patient Position: Sitting, Cuff Size: Large)    Pulse 76    Temp (!) 97 F (36.1 C) (Temporal)    Ht 5' 6.5" (1.689 m)    Wt 248 lb (112.5 kg)    SpO2 98%    BMI 39.43 kg/m   Physical Exam Cardiovascular:     Rate and Rhythm: Normal rate.     Pulses: Normal pulses.  Pulmonary:     Effort: Pulmonary effort is normal.  Musculoskeletal:        General: Tenderness present.     Right lower leg: Edema present.     Left lower leg: No edema.  Skin:    Findings: No erythema.  Neurological:     Mental Status: She is alert and oriented to person, place, and time.   Assessment & Plan:  This  visit occurred during the SARS-CoV-2 public health emergency.  Safety protocols were in place, including screening questions prior to the visit, additional usage of staff PPE, and extensive cleaning of exam room while observing appropriate contact time as indicated for disinfecting solutions.   Dominique Burton was seen today for acute visit.  Diagnoses and all orders for this visit:  Edema of right lower leg -     VAS [place] LOWER EXTREMITY VENOUS (DVT); Future  Essential hypertension -     KLOR-CON M20 20 MEQ tablet; Take 1 tablet (20 mEq total) by mouth 2 (two) times daily.  Hypokalemia -     KLOR-CON M20 20 MEQ tablet; Take 1 tablet (20 mEq total) by mouth 2 (two) times daily.  GAD (generalized anxiety disorder)  Class 2 severe obesity due to excess calories with serious comorbidity and body mass index (BMI) of 39.0 to 39.9 in adult ([place]) -     Amb Ref to Medical Weight Management   Problem List Items Addressed This Visit       Cardiovascular and Mediastinum   Essential hypertension    Reports muscle cramps with HCTZ and KCL 75mEq daily BMP Latest Ref Rng & Units 11/21/2021 04/11/2021 01/02/2021  Glucose 70 - 99 mg/dL 102(H) 89 102(H)  BUN 6 - 23 mg/dL 11 10 12   Creatinine 0.40 - 1.20 mg/dL 0.76 0.76 0.79  BUN/Creat Ratio 9 - 23 - - -  Sodium 135 - 145 mEq/L 142 141 141  Potassium 3.5 - 5.1 mEq/L 3.6 3.6 3.3(L)  Chloride 96 - 112 mEq/L 103 101 102  CO2 19 - 32 mEq/L 30 31 33(H)  Calcium 8.4 - 10.5 mg/dL 9.3 9.4 9.7   BP Readings from Last 3 Encounters:  12/22/21 138/82  11/21/21 140/84  11/07/21 130/88   Maintain lisinopril/HCTZ dose Increase potassium to 59mEq BID F/up in 3-20months      Relevant Medications   KLOR-CON M20 20 MEQ tablet     Other   Class 2 severe obesity due to excess calories with serious comorbidity and body mass index (BMI) of 39.0 to 39.9 in adult [facility])    Reports difficulty with weight loss despite portion control and daily exercise x 16month. Wt Readings  from Last 3 Encounters:  12/22/21 248 lb (112.5 kg)  11/21/21 244 lb 9.6 oz (110.9 kg)  11/07/21 241 lb 6.4 oz (109.5 kg)   Entered referral to weight management clinic      Relevant Orders   Amb Ref to Medical Weight Management   GAD (generalized anxiety disorder)    She decreased dose to  5mg  and reports mood has been stable. Advised to maintain dose F/up in 3-20months      Relevant Medications   escitalopram (LEXAPRO) 10 MG tablet   Hypokalemia   Relevant Medications   KLOR-CON M20 20 MEQ tablet   Other Visit Diagnoses     Edema of right lower leg    -  Primary   Relevant Orders   VAS [place] LOWER EXTREMITY VENOUS (DVT)       Follow-up: Return in about 3 months (around 03/21/2022) for HTN, GAD and weight management.  Wilfred Lacy, NP

## 2021-12-22 NOTE — Patient Instructions (Addendum)
Use thigh high compression stocking daily, off at bedtime  Continue low sodium diet and exercise  You will be contacted to schedule appt for leg [place] and weight management clinic.

## 2021-12-25 NOTE — Telephone Encounter (Signed)
Rx sent in by provider on 12/22/21

## 2022-01-09 ENCOUNTER — Telehealth: Payer: Self-pay | Admitting: Nurse Practitioner

## 2022-01-09 NOTE — Telephone Encounter (Signed)
Pt is wanting a sleep study ordered for her because of her snoring. I let her know she would have needed to have discussed this with you in the past to get this referral. I offered an app, she declined. She is aware she may need an appointment. Dominique Burton  ?

## 2022-01-12 ENCOUNTER — Other Ambulatory Visit: Payer: Self-pay

## 2022-01-13 ENCOUNTER — Encounter: Payer: Self-pay | Admitting: Nurse Practitioner

## 2022-01-13 ENCOUNTER — Ambulatory Visit (INDEPENDENT_AMBULATORY_CARE_PROVIDER_SITE_OTHER): Payer: Commercial Managed Care - PPO | Admitting: Nurse Practitioner

## 2022-01-13 VITALS — BP 150/90 | HR 70 | Temp 97.4°F | Wt 249.0 lb

## 2022-01-13 DIAGNOSIS — I10 Essential (primary) hypertension: Secondary | ICD-10-CM

## 2022-01-13 DIAGNOSIS — R0683 Snoring: Secondary | ICD-10-CM | POA: Insufficient documentation

## 2022-01-13 DIAGNOSIS — R3989 Other symptoms and signs involving the genitourinary system: Secondary | ICD-10-CM

## 2022-01-13 LAB — POCT URINALYSIS DIPSTICK
Bilirubin, UA: NEGATIVE
Glucose, UA: NEGATIVE
Ketones, UA: NEGATIVE
Leukocytes, UA: NEGATIVE
Nitrite, UA: NEGATIVE
Protein, UA: NEGATIVE
Spec Grav, UA: 1.025 (ref 1.010–1.025)
Urobilinogen, UA: 0.2 E.U./dL
pH, UA: 5.5 (ref 5.0–8.0)

## 2022-01-13 NOTE — Progress Notes (Signed)
? ?Acute Office Visit ? ?Subjective:  ? ? Patient ID: Dominique Burton, female    DOB: [birth year], 56 y.o.   MRN: [id number] ? ?Chief Complaint  ?Patient presents with  ? referral request  ?  Pt is requesting sleep study referral due to excessive snoring. Pt c/o pressure feeling in bladder, requested UA to be done.   ? ? ?HPI ?Patient is in today for snoring. She has been told that she snores at night and wants to make sure that she doesn't have sleep apnea. Endorses feeling tired during the day. Denies any observing her stop breathing while she sleeps and headaches in the morning. ? ?STOP BANG Questionnaire:  ? ?Snoring: Yes ?Tired: Yes ?Observed apnea: No ?Pressure: Yes, history of high blood pressure ?BMI: Yes, BMI 39.5 ?Age: Yes, older than 30 ?Neck size: Yes, 17.4" ?Gender: No (female) ? ?Total Score: 6 ? ?She a UTI a few weeks ago. She was prescribed an antibiotic. She endorses mild suprapubic pressure, but no dysuria or urinary frequency. Denies fevers, back pain. Symptoms have overall improved, but she would like another U/A to ensure the infection has cleared.  ? ?Past Medical History:  ?Diagnosis Date  ? Hypertension   ? Vitamin D deficiency   ? ? ?Past Surgical History:  ?Procedure Laterality Date  ? ABDOMINAL HYSTERECTOMY    ? PARTIAL  ? CESAREAN SECTION    ? ? ?Family History  ?Problem Relation Age of Onset  ? Hypertension Mother   ? Diabetes Father   ? Hypertension Father   ? Hypertension Sister   ? Heart disease Brother   ? Hypertension Brother   ? Hypertension Maternal Grandmother   ? Hypertension Maternal Grandfather   ? ? ?Social History  ? ?Socioeconomic History  ? Marital status: Married  ?  Spouse name: Not on file  ? Number of children: Not on file  ? Years of education: Not on file  ? Highest education level: Not on file  ?Occupational History  ? Not on file  ?Tobacco Use  ? Smoking status: Never  ? Smokeless tobacco: Never  ?Vaping Use  ? Vaping Use: Never used  ?Substance and Sexual Activity  ?  Alcohol use: No  ? Drug use: No  ? Sexual activity: Yes  ?Other Topics Concern  ? Not on file  ?Social History Narrative  ? Not on file  ? ?Social Determinants of Health  ? ?Financial Resource Strain: Not on file  ?Food Insecurity: Not on file  ?Transportation Needs: Not on file  ?Physical Activity: Not on file  ?Stress: Not on file  ?Social Connections: Not on file  ?Intimate Partner Violence: Not on file  ? ? ?Outpatient Medications Prior to Visit  ?Medication Sig Dispense Refill  ? cetirizine (ZYRTEC) 10 MG tablet Take 1 tablet (10 mg total) by mouth daily. 30 tablet 5  ? escitalopram (LEXAPRO) 10 MG tablet Take 0.5 tablets (5 mg total) by mouth at bedtime. 45 tablet 3  ? fluticasone (FLONASE) 50 MCG/ACT nasal spray Place 1 spray into both nostrils 2 (two) times daily. 16 g 5  ? KLOR-CON M20 20 MEQ tablet Take 1 tablet (20 mEq total) by mouth 2 (two) times daily. 180 tablet 1  ? lisinopril-hydrochlorothiazide (ZESTORETIC) 20-25 MG tablet Take 1 tablet by mouth daily. 90 tablet 1  ? omeprazole (PRILOSEC) 20 MG capsule Take 1 capsule (20 mg total) by mouth daily. 90 capsule 0  ? Vitamin D, Ergocalciferol, (DRISDOL) 1.25 MG (50000 UNIT) CAPS capsule  2x/week x 4weeks, then 1x/week x 6weeks 14 capsule 0  ? ?No facility-administered medications prior to visit.  ? ? ?No Known Allergies ? ?Review of Systems ?See pertinent positives and negatives per HPI. ?   ?Objective:  ?  ?Physical Exam ?Vitals and nursing note reviewed.  ?Constitutional:   ?   General: She is not in acute distress. ?   Appearance: Normal appearance. She is obese.  ?HENT:  ?   Head: Normocephalic.  ?Eyes:  ?   Conjunctiva/sclera: Conjunctivae normal.  ?Cardiovascular:  ?   Rate and Rhythm: Normal rate and regular rhythm.  ?   Pulses: Normal pulses.  ?   Heart sounds: Normal heart sounds.  ?Pulmonary:  ?   Effort: Pulmonary effort is normal.  ?   Breath sounds: Normal breath sounds.  ?Musculoskeletal:  ?   Cervical back: Normal range of motion and neck  supple. No tenderness.  ?Skin: ?   General: Skin is warm.  ?Neurological:  ?   General: No focal deficit present.  ?   Mental Status: She is alert and oriented to person, place, and time.  ?Psychiatric:     ?   Mood and Affect: Mood normal.     ?   Behavior: Behavior normal.     ?   Thought Content: Thought content normal.     ?   Judgment: Judgment normal.  ? ? ?BP (!) 150/90 (BP Location: Right Arm, Cuff Size: Large)   Pulse 70   Temp (!) 97.4 ?F (36.3 ?C) (Temporal)   Wt 249 lb (112.9 kg)   SpO2 98%   BMI 39.59 kg/m?  ?Wt Readings from Last 3 Encounters:  ?01/13/22 249 lb (112.9 kg)  ?12/22/21 248 lb (112.5 kg)  ?11/21/21 244 lb 9.6 oz (110.9 kg)  ? ? ?Health Maintenance Due  ?Topic Date Due  ? PAP SMEAR-Modifier  04/02/2020  ? COVID-19 Vaccine (4 - Booster for Pfizer series) 01/13/2021  ? MAMMOGRAM  01/11/2022  ? ? ?There are no preventive care reminders to display for this patient. ? ? ?Lab Results  ?Component Value Date  ? TSH 1.76 04/11/2021  ? ?Lab Results  ?Component Value Date  ? WBC 5.9 11/21/2021  ? HGB 12.9 11/21/2021  ? HCT 40.2 11/21/2021  ? MCV 88.5 11/21/2021  ? PLT 300.0 11/21/2021  ? ?Lab Results  ?Component Value Date  ? NA 142 11/21/2021  ? K 3.6 11/21/2021  ? CO2 30 11/21/2021  ? GLUCOSE 102 (H) 11/21/2021  ? BUN 11 11/21/2021  ? CREATININE 0.76 11/21/2021  ? BILITOT 0.4 08/12/2020  ? ALKPHOS 97 08/12/2020  ? AST 16 08/12/2020  ? ALT 14 08/12/2020  ? PROT 7.2 08/12/2020  ? ALBUMIN 4.5 08/12/2020  ? CALCIUM 9.3 11/21/2021  ? ANIONGAP 6 02/05/2017  ? GFR 87.37 11/21/2021  ? ?Lab Results  ?Component Value Date  ? CHOL 146 04/11/2021  ? ?Lab Results  ?Component Value Date  ? HDL 50.30 04/11/2021  ? ?Lab Results  ?Component Value Date  ? LDLCALC 78 04/11/2021  ? ?Lab Results  ?Component Value Date  ? TRIG 89.0 04/11/2021  ? ?Lab Results  ?Component Value Date  ? CHOLHDL 3 04/11/2021  ? ?Lab Results  ?Component Value Date  ? HGBA1C 6.2 11/21/2021  ? ? ?   ?Assessment & Plan:  ? ?Problem List  Items Addressed This Visit   ? ?  ? Cardiovascular and Mediastinum  ? Essential hypertension  ?  Blood pressure elevated in  office today, recheck was 150/90. She states she checks her blood pressure at home and it normally runs 120s/80s. Encouraged her to keep checking her blood pressure at home daily and call if >140/90 for several readings.  ?  ?  ?  ? Other  ? Snoring - Primary  ?  She endorses being told that she snores at night. Stop-Bang questionnaire was positive with a score of 6. BMI is 39.5. Discussed that having elevated weight can lead to sleep apnea. Will order sleep study today. Discussed weight loss. Follow up with PCP in 2 months as planned from last visit.  ?  ?  ? Relevant Orders  ? Ambulatory referral to Sleep Studies  ? ?Other Visit Diagnoses   ? ? Sensation of pressure in bladder area      ? U/A today negative for UTI. There is 2+ blood, which has improved from prior U/A. F/U with any concerns or ongoing symptoms.   ? Relevant Orders  ? POCT urinalysis dipstick (Completed)  ? ?  ? ? ?No orders of the defined types were placed in this encounter. ? ? ?Gerre Scull, NP ? ?

## 2022-01-13 NOTE — Assessment & Plan Note (Signed)
Blood pressure elevated in office today, recheck was 150/90. She states she checks her blood pressure at home and it normally runs 120s/80s. Encouraged her to keep checking her blood pressure at home daily and call if >140/90 for several readings.  ?

## 2022-01-13 NOTE — Patient Instructions (Signed)
It was great to see you! ? ?I have ordered a home sleep study for you.  ? ?Your urine has cleared up and shows no signs of infection ? ?Let's follow-up in 2 months, sooner if you have concerns. ? ?If a referral was placed today, you will be contacted for an appointment. Please note that routine referrals can sometimes take up to 3-4 weeks to process. Please call our office if you haven't heard anything after this time frame. ? ?Take care, ? ?Vance Peper, NP ? ?

## 2022-01-13 NOTE — Assessment & Plan Note (Signed)
She endorses being told that she snores at night. Stop-Bang questionnaire was positive with a score of 6. BMI is 39.5. Discussed that having elevated weight can lead to sleep apnea. Will order sleep study today. Discussed weight loss. Follow up with PCP in 2 months as planned from last visit.  ?

## 2022-01-26 ENCOUNTER — Telehealth: Payer: Self-pay | Admitting: Orthopaedic Surgery

## 2022-01-26 NOTE — Telephone Encounter (Signed)
Pt is calling she wants to know what did Dr Derry Lory do from Dr Erlinda Hong, was the injections the same? ? ?Please call the pt  ?

## 2022-01-28 NOTE — Telephone Encounter (Signed)
Called patient no answer. LMOM. She has schedule appt  ?

## 2022-01-29 ENCOUNTER — Other Ambulatory Visit: Payer: Self-pay

## 2022-01-29 ENCOUNTER — Ambulatory Visit (INDEPENDENT_AMBULATORY_CARE_PROVIDER_SITE_OTHER): Payer: Commercial Managed Care - PPO | Admitting: Orthopaedic Surgery

## 2022-01-29 DIAGNOSIS — M170 Bilateral primary osteoarthritis of knee: Secondary | ICD-10-CM

## 2022-01-29 DIAGNOSIS — M1711 Unilateral primary osteoarthritis, right knee: Secondary | ICD-10-CM | POA: Diagnosis not present

## 2022-01-29 DIAGNOSIS — M1712 Unilateral primary osteoarthritis, left knee: Secondary | ICD-10-CM

## 2022-01-29 MED ORDER — METHYLPREDNISOLONE ACETATE 40 MG/ML IJ SUSP
40.0000 mg | INTRAMUSCULAR | Status: AC | PRN
  Administered 2022-01-29: 40 mg via INTRA_ARTICULAR

## 2022-01-29 MED ORDER — LIDOCAINE HCL 1 % IJ SOLN
2.0000 mL | INTRAMUSCULAR | Status: AC | PRN
  Administered 2022-01-29: 2 mL

## 2022-01-29 MED ORDER — BUPIVACAINE HCL 0.25 % IJ SOLN
2.0000 mL | INTRAMUSCULAR | Status: AC | PRN
  Administered 2022-01-29: 2 mL via INTRA_ARTICULAR

## 2022-01-29 NOTE — Progress Notes (Signed)
? ?Office Visit Note ?  ?Patient: Dominique Burton           ?Date of Birth: 12/26/1965           ?MRN: [id number] ?Visit Date: 01/29/2022 ?             ?Requested by: Anne Ng, NP ?[phone] ?[place],  [place] [address] ?PCP: Anne Ng, NP ? ? ?Assessment & Plan: ?Visit Diagnoses:  ?1. Bilateral primary osteoarthritis of knee   ? ? ?Plan: Impression is bilateral knee degenerative joint disease right greater than left.  Today, we had a discussion about cortisone injection versus viscosupplementation injection versus total knee arthroplasty.  She would like to proceed with cortisone injection to the right knee today.  If her symptoms improve, she will follow-up for left knee cortisone injection.  Call with concerns or questions in meantime. ? ?Follow-Up Instructions: Return if symptoms worsen or fail to improve.  ? ?Orders:  ?Orders Placed This Encounter  ?Procedures  ? Large Joint Inj: R knee  ? ?No orders of the defined types were placed in this encounter. ? ? ? ? Procedures: ?Large Joint Inj: R knee on 01/29/2022 2:33 PM ?Indications: pain ?Details: 22 G needle, anterolateral approach ?Medications: 2 mL lidocaine 1 %; 2 mL bupivacaine 0.25 %; 40 mg methylPREDNISolone acetate 40 MG/ML ? ? ? ? ?Clinical Data: ?No additional findings. ? ? ?Subjective: ?Chief Complaint  ?Patient presents with  ? Right Knee - Pain  ? Left Knee - Pain  ? ? ?HPI patient is a pleasant 56 year old female with underlying bilateral knee osteoarthritis who comes in today with recurrent pain right greater than left.  The pain she has is primarily to the medial aspect of both knees.  Any walking or increased activity worsens her symptoms.  She has tried over-the-counter pain medication without relief.  Of note, she underwent left knee cortisone injection back in 2021 with good relief until recently.  Previous right knee injection about 4 months ago provided relief until recently.  No previous viscosupplementation  injections. ? ?Review of Systems as detailed in HPI.  All others reviewed and are negative. ? ? ?Objective: ?Vital Signs: There were no vitals taken for this visit. ? ?Physical Exam well-developed well-nourished female in no acute distress.  Alert and oriented x3. ? ?Ortho Exam bilateral knee exam shows a small effusion on the right.  No effusion on the left.  Medial joint line tenderness both sides.  Range of motion 0 to 115 degrees.  She is neurovascular intact distally. ? ?Specialty Comments:  ?No specialty comments available. ? ?Imaging: ?No new imaging ? ? ?PMFS History: ?Patient Active Problem List  ? Diagnosis Date Noted  ? Snoring 01/13/2022  ? GAD (generalized anxiety disorder) 08/22/2021  ? Hyperglycemia 01/02/2021  ? Class 2 severe obesity due to excess calories with serious comorbidity and body mass index (BMI) of 39.0 to 39.9 in adult [facility]) 01/02/2021  ? Essential hypertension 01/11/2018  ? Vitamin D deficiency 01/11/2018  ? Hypokalemia 01/11/2018  ? ?Past Medical History:  ?Diagnosis Date  ? Hypertension   ? Vitamin D deficiency   ?  ?Family History  ?Problem Relation Age of Onset  ? Hypertension Mother   ? Diabetes Father   ? Hypertension Father   ? Hypertension Sister   ? Heart disease Brother   ? Hypertension Brother   ? Hypertension Maternal Grandmother   ? Hypertension Maternal Grandfather   ?  ?Past Surgical History:  ?  Procedure Laterality Date  ? ABDOMINAL HYSTERECTOMY    ? PARTIAL  ? CESAREAN SECTION    ? ?Social History  ? ?Occupational History  ? Not on file  ?Tobacco Use  ? Smoking status: Never  ? Smokeless tobacco: Never  ?Vaping Use  ? Vaping Use: Never used  ?Substance and Sexual Activity  ? Alcohol use: No  ? Drug use: No  ? Sexual activity: Yes  ? ? ? ? ? ? ?

## 2022-03-20 ENCOUNTER — Telehealth: Payer: Self-pay | Admitting: Nurse Practitioner

## 2022-03-20 NOTE — Telephone Encounter (Signed)
Pt tried calling to schedule an appointment concerning a referral [phone] for snoring. I gave her the number for the facility and they told her they did not have it.  ?\\[facility]    ?Could you please check on this.  ?

## 2022-04-22 ENCOUNTER — Ambulatory Visit (INDEPENDENT_AMBULATORY_CARE_PROVIDER_SITE_OTHER): Payer: Commercial Managed Care - PPO | Admitting: Podiatry

## 2022-04-22 ENCOUNTER — Encounter: Payer: Self-pay | Admitting: Podiatry

## 2022-04-22 DIAGNOSIS — B351 Tinea unguium: Secondary | ICD-10-CM

## 2022-04-22 MED ORDER — CICLOPIROX 8 % EX SOLN: Freq: Every day | CUTANEOUS | 0 refills | Status: DC

## 2022-04-24 NOTE — Progress Notes (Signed)
Subjective:  Patient ID: Dominique Burton, female    DOB: 06-Jan-1966,  MRN: [id number]  Chief Complaint  Patient presents with   Nail Problem    56 y.o. female presents with the above complaint.  Patient presents with complaint right thickened elongated dystrophic mycotic nail to the hallux.  Patient states there is still some problem with it.  She states that there is fungus in it.  She had a damage done last year but has not grown back the same way.  She wanted to get it evaluated she has not seen anyone else prior to seeing me for this.  She denies any other acute complaints.  She has not tried any medication for nail fungus   Review of Systems: Negative except as noted in the HPI. Denies N/V/F/Ch.  Past Medical History:  Diagnosis Date   Hypertension    Vitamin D deficiency     Current Outpatient Medications:    ciclopirox (PENLAC) 8 % solution, Apply topically at bedtime. Apply over nail and surrounding skin. Apply daily over previous coat. After seven (7) days, may remove with alcohol and continue cycle., Disp: 6.6 mL, Rfl: 0   cetirizine (ZYRTEC) 10 MG tablet, Take 1 tablet (10 mg total) by mouth daily., Disp: 30 tablet, Rfl: 5   escitalopram (LEXAPRO) 10 MG tablet, Take 0.5 tablets (5 mg total) by mouth at bedtime., Disp: 45 tablet, Rfl: 3   fluticasone (FLONASE) 50 MCG/ACT nasal spray, Place 1 spray into both nostrils 2 (two) times daily., Disp: 16 g, Rfl: 5   KLOR-CON M20 20 MEQ tablet, Take 1 tablet (20 mEq total) by mouth 2 (two) times daily., Disp: 180 tablet, Rfl: 1   lisinopril-hydrochlorothiazide (ZESTORETIC) 20-25 MG tablet, Take 1 tablet by mouth daily., Disp: 90 tablet, Rfl: 1   omeprazole (PRILOSEC) 20 MG capsule, Take 1 capsule (20 mg total) by mouth daily., Disp: 90 capsule, Rfl: 0   Vitamin D, Ergocalciferol, (DRISDOL) 1.25 MG (50000 UNIT) CAPS capsule, 2x/week x 4weeks, then 1x/week x 6weeks, Disp: 14 capsule, Rfl: 0  Social History   Tobacco Use  Smoking Status  Never  Smokeless Tobacco Never    No Known Allergies Objective:  There were no vitals filed for this visit. There is no height or weight on file to calculate BMI. Constitutional Well developed. Well nourished.  Vascular Dorsalis pedis pulses palpable bilaterally. Posterior tibial pulses palpable bilaterally. Capillary refill normal to all digits.  No cyanosis or clubbing noted. Pedal hair growth normal.  Neurologic Normal speech. Oriented to person, place, and time. Epicritic sensation to light touch grossly present bilaterally.  Dermatologic Nails thickened elongated dystrophic mycotic right hallux nail.  Mild pain on palpation. Skin within normal limits  Orthopedic: Normal joint ROM without pain or crepitus bilaterally. No visible deformities. No bony tenderness.   Radiographs: None Assessment:   1. Nail fungus   2. Onychomycosis due to dermatophyte    Plan:  Patient was evaluated and treated and all questions answered.  Right hallux onychomycosis -Educated the patient on the etiology of onychomycosis and various treatment options associated with improving the fungal load.  I explained to the patient that there is 3 treatment options available to treat the onychomycosis including topical, p.o., laser treatment.  Patient elected to undergo topical medication with Penlac.  I have asked her to apply twice a day.  This will take about 6 to 8 months to heal.  Patient states understand will do so in   No follow-ups on file.

## 2022-06-05 ENCOUNTER — Other Ambulatory Visit: Payer: Self-pay | Admitting: Nurse Practitioner

## 2022-06-05 DIAGNOSIS — E559 Vitamin D deficiency, unspecified: Secondary | ICD-10-CM

## 2022-06-05 NOTE — Telephone Encounter (Signed)
Per Charlotte's Note on her labs on 1/132023 "Persistent low vitamin D: take 50000IU 2x/week x 4weeks, then once a week x 6weeks. Then switch to 2000IU from over the counter."

## 2022-06-17 ENCOUNTER — Encounter (INDEPENDENT_AMBULATORY_CARE_PROVIDER_SITE_OTHER): Payer: Self-pay

## 2022-06-25 ENCOUNTER — Encounter: Payer: Self-pay | Admitting: Nurse Practitioner

## 2022-06-25 ENCOUNTER — Ambulatory Visit (INDEPENDENT_AMBULATORY_CARE_PROVIDER_SITE_OTHER): Payer: Commercial Managed Care - PPO | Admitting: Nurse Practitioner

## 2022-06-25 VITALS — BP 138/88 | HR 68 | Temp 96.6°F | Ht 66.5 in | Wt 247.6 lb

## 2022-06-25 DIAGNOSIS — F411 Generalized anxiety disorder: Secondary | ICD-10-CM | POA: Diagnosis not present

## 2022-06-25 DIAGNOSIS — I10 Essential (primary) hypertension: Secondary | ICD-10-CM

## 2022-06-25 DIAGNOSIS — R739 Hyperglycemia, unspecified: Secondary | ICD-10-CM

## 2022-06-25 DIAGNOSIS — E559 Vitamin D deficiency, unspecified: Secondary | ICD-10-CM | POA: Diagnosis not present

## 2022-06-25 DIAGNOSIS — R140 Abdominal distension (gaseous): Secondary | ICD-10-CM | POA: Diagnosis not present

## 2022-06-25 DIAGNOSIS — K219 Gastro-esophageal reflux disease without esophagitis: Secondary | ICD-10-CM

## 2022-06-25 DIAGNOSIS — E876 Hypokalemia: Secondary | ICD-10-CM

## 2022-06-25 LAB — COMPREHENSIVE METABOLIC PANEL
ALT: 9 U/L (ref 0–35)
AST: 16 U/L (ref 0–37)
Albumin: 4.4 g/dL (ref 3.5–5.2)
Alkaline Phosphatase: 76 U/L (ref 39–117)
BUN: 11 mg/dL (ref 6–23)
CO2: 31 mEq/L (ref 19–32)
Calcium: 9.1 mg/dL (ref 8.4–10.5)
Chloride: 101 mEq/L (ref 96–112)
Creatinine, Ser: 0.81 mg/dL (ref 0.40–1.20)
GFR: 80.61 mL/min (ref >60.00)
Glucose, Bld: 89 mg/dL (ref 70–99)
Potassium: 3 mEq/L — ABNORMAL LOW (ref 3.5–5.1)
Sodium: 141 mEq/L (ref 135–145)
Total Bilirubin: 0.7 mg/dL (ref 0.2–1.2)
Total Protein: 7.3 g/dL (ref 6.0–8.3)

## 2022-06-25 LAB — VITAMIN D 25 HYDROXY (VIT D DEFICIENCY, FRACTURES): VITD: 19.65 ng/mL — ABNORMAL LOW (ref 30.00–100.00)

## 2022-06-25 LAB — LIPASE: Lipase: 18 U/L (ref 11.0–59.0)

## 2022-06-25 LAB — HEMOGLOBIN A1C: Hgb A1c MFr Bld: 6.2 % (ref 4.6–6.5)

## 2022-06-25 MED ORDER — LISINOPRIL-HYDROCHLOROTHIAZIDE 20-25 MG PO TABS: 1.0000 | ORAL_TABLET | Freq: Every day | ORAL | 1 refills | Status: DC

## 2022-06-25 MED ORDER — OMEPRAZOLE 20 MG PO CPDR: 20.0000 mg | DELAYED_RELEASE_CAPSULE | Freq: Every day | ORAL | 0 refills | Status: DC

## 2022-06-25 NOTE — Patient Instructions (Addendum)
Possible medication that can be used for weight loss: Wegovy vs Qsymia vs Contrave  Go to lab Maintain BRAT diet x3days then proceed to low fat/low carb/low sugar diet. You will be contacted to schedule appt for ABD [place]  Schedule appt for mammogram Schedule appt with GYN for repeat PAP

## 2022-06-25 NOTE — Assessment & Plan Note (Addendum)
Repeat hgbA1c: stable at 6.2% Advised about need for lifestyle modification

## 2022-06-25 NOTE — Assessment & Plan Note (Addendum)
BP at goal BP Readings from Last 3 Encounters:  06/25/22 138/88  01/13/22 (!) 150/90  12/22/21 138/82   Check CMP: normal renal and liver function. Persistently low potassium despite use of potassium supplement, this is due to use of HCTZ. I changed lisinopril/HCTZ to lisinopril only. I sent potassium supplement x 5days. Will refill HCTZ for prn use if needed for LE edema.

## 2022-06-25 NOTE — Assessment & Plan Note (Addendum)
Repeat Vit. D: Low vit D: start 50000IU weekly x 12weeks, then switch to 2000IU daily OTC.

## 2022-06-25 NOTE — Progress Notes (Signed)
Established Patient Visit  Patient: Dominique Burton   DOB: [birth year]   56 y.o. Female  MRN: [id number] Visit Date: 06/27/2022  Subjective:    Chief Complaint  Patient presents with   Office Visit    6 month f/u  C/o stomach irritability x 6 months  No other concerns    HPI Essential hypertension BP at goal BP Readings from Last 3 Encounters:  06/25/22 138/88  01/13/22 (!) 150/90  12/22/21 138/82   Check CMP: normal renal and liver function. Persistently low potassium despite use of potassium supplement, this is due to use of HCTZ. I changed lisinopril/HCTZ to lisinopril only. I sent potassium supplement x 5days. Will refill HCTZ for prn use if needed for LE edema.  Vitamin D deficiency Repeat Vit. D: Low vit D: start 50000IU weekly x 12weeks, then switch to 2000IU daily OTC.  Hyperglycemia Repeat hgbA1c: stable at 6.2% Advised about need for lifestyle modification  GAD (generalized anxiety disorder) Improved and stable mood with lexapro 5mg  Maintain med dose  Postprandial abdominal bloating Associated with heart burn, nausea and vomiting,  worse with high fat food or diary. No change in BM, no constipation, no diarrhea, no blood in stool Some improvement with omeprazole Last colonoscopy 2016: need report  Cholecystitis/cholelithiasis vs GERD? Check ABD 2017 Check CMP and lipase: normal Maintain BRAT diet for 2days then advance to heart healthy diet as tolerated  Wt Readings from Last 3 Encounters:  06/25/22 247 lb 9.6 oz (112.3 kg)  01/13/22 249 lb (112.9 kg)  12/22/21 248 lb (112.5 kg)    Reviewed medical, surgical, and social history today  Medications: Outpatient Medications Prior to Visit  Medication Sig   cetirizine (ZYRTEC) 10 MG tablet Take 1 tablet (10 mg total) by mouth daily.   escitalopram (LEXAPRO) 10 MG tablet Take 0.5 tablets (5 mg total) by mouth at bedtime.   fluticasone (FLONASE) 50 MCG/ACT nasal spray Place 1 spray into both  nostrils 2 (two) times daily.   [DISCONTINUED] KLOR-CON M20 20 MEQ tablet Take 1 tablet (20 mEq total) by mouth 2 (two) times daily.   [DISCONTINUED] lisinopril-hydrochlorothiazide (ZESTORETIC) 20-25 MG tablet Take 1 tablet by mouth daily.   [DISCONTINUED] omeprazole (PRILOSEC) 20 MG capsule Take 1 capsule (20 mg total) by mouth daily.   [DISCONTINUED] Vitamin D, Ergocalciferol, (DRISDOL) 1.25 MG (50000 UNIT) CAPS capsule 2x/week x 4weeks, then 1x/week x 6weeks   ciclopirox (PENLAC) 8 % solution Apply topically at bedtime. Apply over nail and surrounding skin. Apply daily over previous coat. After seven (7) days, may remove with alcohol and continue cycle.   No facility-administered medications prior to visit.   Reviewed past medical and social history.   ROS per HPI above      Objective:  BP 138/88 (BP Location: Right Arm, Patient Position: Sitting, Cuff Size: Normal)   Pulse 68   Temp (!) 96.6 F (35.9 C) (Temporal)   Ht 5' 6.5" (1.689 m)   Wt 247 lb 9.6 oz (112.3 kg)   SpO2 98%   BMI 39.36 kg/m      Physical Exam Cardiovascular:     Rate and Rhythm: Normal rate and regular rhythm.     Pulses: Normal pulses.     Heart sounds: Normal heart sounds.  Pulmonary:     Effort: Pulmonary effort is normal.     Breath sounds: Normal breath sounds.  Abdominal:     General:  Bowel sounds are normal. There is distension.     Palpations: Abdomen is soft. There is no mass.     Tenderness: There is no abdominal tenderness. There is no right CVA tenderness, left CVA tenderness or guarding.  Musculoskeletal:     Right lower leg: No edema.     Left lower leg: No edema.  Neurological:     Mental Status: She is alert and oriented to person, place, and time.     Results for orders placed or performed in visit on 06/25/22  Hemoglobin A1c  Result Value Ref Range   Hgb A1c MFr Bld 6.2 4.6 - 6.5 %  Vitamin D (25 hydroxy)  Result Value Ref Range   VITD 19.65 (L) 30.00 - 100.00 ng/mL   Comprehensive metabolic panel  Result Value Ref Range   Sodium 141 135 - 145 mEq/L   Potassium 3.0 (L) 3.5 - 5.1 mEq/L   Chloride 101 96 - 112 mEq/L   CO2 31 19 - 32 mEq/L   Glucose, Bld 89 70 - 99 mg/dL   BUN 11 6 - 23 mg/dL   Creatinine, Ser 3.15 0.40 - 1.20 mg/dL   Total Bilirubin 0.7 0.2 - 1.2 mg/dL   Alkaline Phosphatase 76 39 - 117 U/L   AST 16 0 - 37 U/L   ALT 9 0 - 35 U/L   Total Protein 7.3 6.0 - 8.3 g/dL   Albumin 4.4 3.5 - 5.2 g/dL   GFR 17.61 >60.73 mL/min   Calcium 9.1 8.4 - 10.5 mg/dL  Lipase  Result Value Ref Range   Lipase 18.0 11.0 - 59.0 U/L      Assessment & Plan:    Problem List Items Addressed This Visit       Cardiovascular and Mediastinum   Essential hypertension - Primary    BP at goal BP Readings from Last 3 Encounters:  06/25/22 138/88  01/13/22 (!) 150/90  12/22/21 138/82   Check CMP: normal renal and liver function. Persistently low potassium despite use of potassium supplement, this is due to use of HCTZ. I changed lisinopril/HCTZ to lisinopril only. I sent potassium supplement x 5days. Will refill HCTZ for prn use if needed for LE edema.      Relevant Medications   lisinopril (ZESTRIL) 20 MG tablet   Other Relevant Orders   Comprehensive metabolic panel (Completed)     Other   GAD (generalized anxiety disorder)    Improved and stable mood with lexapro 5mg  Maintain med dose      Hyperglycemia    Repeat hgbA1c: stable at 6.2% Advised about need for lifestyle modification      Relevant Orders   Hemoglobin A1c (Completed)   Hypokalemia   Relevant Medications   KLOR-CON M20 20 MEQ tablet   Postprandial abdominal bloating    Associated with heart burn, nausea and vomiting,  worse with high fat food or diary. No change in BM, no constipation, no diarrhea, no blood in stool Some improvement with omeprazole Last colonoscopy 2016: need report  Cholecystitis/cholelithiasis vs GERD? Check ABD 2017 Check CMP and lipase:  normal Maintain BRAT diet for 2days then advance to heart healthy diet as tolerated      Relevant Orders   US Abdomen Limited RUQ (LIVER/GB)   Comprehensive metabolic panel (Completed)   Lipase (Completed)   Vitamin D deficiency    Repeat Vit. D: Low vit D: start 50000IU weekly x 12weeks, then switch to 2000IU daily OTC.      Relevant  Medications   Vitamin D, Ergocalciferol, (DRISDOL) 1.25 MG (50000 UNIT) CAPS capsule   Other Relevant Orders   Vitamin D (25 hydroxy) (Completed)   Other Visit Diagnoses     Gastroesophageal reflux disease without esophagitis       Relevant Medications   omeprazole (PRILOSEC) 20 MG capsule      Return in about 3 months (around 09/25/2022) for DM, HTN, hyperlipidemia (fasting).     Alysia Penna, NP

## 2022-06-25 NOTE — Assessment & Plan Note (Signed)
Improved and stable mood with lexapro 5mg  Maintain med dose

## 2022-06-25 NOTE — Assessment & Plan Note (Addendum)
Associated with heart burn, nausea and vomiting,  worse with high fat food or diary. No change in BM, no constipation, no diarrhea, no blood in stool Some improvement with omeprazole Last colonoscopy 2016: need report  Cholecystitis/cholelithiasis vs GERD? Check ABD [place] Check CMP and lipase: normal Maintain BRAT diet for 2days then advance to heart healthy diet as tolerated

## 2022-06-27 MED ORDER — VITAMIN D (ERGOCALCIFEROL) 1.25 MG (50000 UNIT) PO CAPS: 50000.0000 [IU] | ORAL_CAPSULE | ORAL | 0 refills | Status: DC

## 2022-06-27 MED ORDER — LISINOPRIL 20 MG PO TABS: 20.0000 mg | ORAL_TABLET | Freq: Every day | ORAL | 3 refills | Status: DC

## 2022-06-27 MED ORDER — KLOR-CON M20 20 MEQ PO TBCR: 20.0000 meq | EXTENDED_RELEASE_TABLET | Freq: Two times a day (BID) | ORAL | 0 refills | Status: DC

## 2022-06-29 ENCOUNTER — Telehealth: Payer: Self-pay | Admitting: Nurse Practitioner

## 2022-06-29 NOTE — Telephone Encounter (Signed)
Caller Name: Emmaclaire Call back phone #: [phone]  Reason for Call: Pt would like to speak to Tayah Or Claris Gower regarding her BP medication. She said Claris Gower had mentioned that she wanted to make some changes.

## 2022-06-29 NOTE — Telephone Encounter (Signed)
Called & spoke w/ pt. Pt says she has tolerated lisinopril well from past experiences, she says she is willing to do lisinopril as long as she can take the amlodipine with it so her BP can stay stable like it is. Please advise

## 2022-06-29 NOTE — Telephone Encounter (Signed)
Called & left pt a VM, adv to call back  

## 2022-06-30 NOTE — Telephone Encounter (Signed)
Left VM, adv pt to call back  

## 2022-07-01 ENCOUNTER — Encounter: Payer: Self-pay | Admitting: Nurse Practitioner

## 2022-07-01 NOTE — Telephone Encounter (Signed)
Pt advised.

## 2022-07-02 ENCOUNTER — Other Ambulatory Visit: Payer: Commercial Managed Care - PPO

## 2022-07-07 ENCOUNTER — Ambulatory Visit
Admission: RE | Admit: 2022-07-07 | Discharge: 2022-07-07 | Disposition: A | Discharge status: Home / Self Care | Payer: Commercial Managed Care - PPO | Source: Ambulatory Visit | Attending: Nurse Practitioner | Admitting: Nurse Practitioner

## 2022-07-07 DIAGNOSIS — R140 Abdominal distension (gaseous): Secondary | ICD-10-CM

## 2022-07-31 ENCOUNTER — Encounter: Payer: Self-pay | Admitting: Nurse Practitioner

## 2022-07-31 ENCOUNTER — Ambulatory Visit (INDEPENDENT_AMBULATORY_CARE_PROVIDER_SITE_OTHER): Payer: Commercial Managed Care - PPO | Admitting: Nurse Practitioner

## 2022-07-31 DIAGNOSIS — I10 Essential (primary) hypertension: Secondary | ICD-10-CM

## 2022-07-31 MED ORDER — AMLODIPINE BESYLATE 10 MG PO TABS: 10.0000 mg | ORAL_TABLET | Freq: Every day | ORAL | 5 refills | Status: DC

## 2022-07-31 NOTE — Progress Notes (Signed)
Established Patient Visit  Patient: Dominique Burton   DOB: 1966/09/23   56 y.o. Female  MRN: [id number] Visit Date: 07/31/2022  Subjective:    Chief Complaint  Patient presents with   Office Visit    BP F/u Says bp meds have not been working, checks BP daily, but still is high No other concerns    HPI Essential hypertension Did not take lisinopril this AM. Maintains DASH diet Not consistent with daily exercise. Reports average home BP reading: 140/94 BP Readings from Last 3 Encounters:  07/31/22 (!) 160/84  06/25/22 138/88  01/13/22 (!) 150/90   Advised about the importance of BP compliance. Maintain lisinopril dose at 20mg  in Am Add amlodipine 10mg  at HS Advised to send BP readings via mychart in 2weeks F/up in office in 4weeks   Reviewed medical, surgical, and social history today  Medications: Outpatient Medications Prior to Visit  Medication Sig   cetirizine (ZYRTEC) 10 MG tablet Take 1 tablet (10 mg total) by mouth daily.   ciclopirox (PENLAC) 8 % solution Apply topically at bedtime. Apply over nail and surrounding skin. Apply daily over previous coat. After seven (7) days, may remove with alcohol and continue cycle.   escitalopram (LEXAPRO) 10 MG tablet Take 0.5 tablets (5 mg total) by mouth at bedtime.   fluticasone (FLONASE) 50 MCG/ACT nasal spray Place 1 spray into both nostrils 2 (two) times daily.   KLOR-CON M20 20 MEQ tablet Take 1 tablet (20 mEq total) by mouth 2 (two) times daily.   lisinopril (ZESTRIL) 20 MG tablet Take 1 tablet (20 mg total) by mouth daily.   omeprazole (PRILOSEC) 20 MG capsule Take 1 capsule (20 mg total) by mouth daily.   Vitamin D, Ergocalciferol, (DRISDOL) 1.25 MG (50000 UNIT) CAPS capsule Take 1 capsule (50,000 Units total) by mouth every 7 (seven) days.   No facility-administered medications prior to visit.   Reviewed past medical and social history.   ROS per HPI above      Objective:  BP (!) 160/84   Pulse 69    Temp (!) 96.3 F (35.7 C) (Temporal)   Ht 5' 6.5" (1.689 m)   Wt 247 lb 6.4 oz (112.2 kg)   SpO2 96%   BMI 39.33 kg/m      Physical Exam Vitals reviewed.  Cardiovascular:     Rate and Rhythm: Normal rate.     Pulses: Normal pulses.  Pulmonary:     Effort: Pulmonary effort is normal.  Musculoskeletal:     Right lower leg: No edema.     Left lower leg: No edema.  Neurological:     Mental Status: She is alert and oriented to person, place, and time.     No results found for any visits on 07/31/22.    Assessment & Plan:    Problem List Items Addressed This Visit       Cardiovascular and Mediastinum   Essential hypertension    Did not take lisinopril this AM. Maintains DASH diet Not consistent with daily exercise. Reports average home BP reading: 140/94 BP Readings from Last 3 Encounters:  07/31/22 (!) 160/84  06/25/22 138/88  01/13/22 (!) 150/90   Advised about the importance of BP compliance. Maintain lisinopril dose at 20mg  in Am Add amlodipine 10mg  at HS Advised to send BP readings via mychart in 2weeks F/up in office in 4weeks      Relevant Medications  amLODipine (NORVASC) 10 MG tablet   Return in about 4 weeks (around 08/28/2022) for HTN (repeat BMP).     Wilfred Lacy, NP

## 2022-07-31 NOTE — Assessment & Plan Note (Addendum)
Did not take lisinopril this AM. Maintains DASH diet Not consistent with daily exercise. Reports average home BP reading: 140/94 BP Readings from Last 3 Encounters:  07/31/22 (!) 160/84  06/25/22 138/88  01/13/22 (!) 150/90   Advised about the importance of BP compliance. Maintain lisinopril dose at 20mg  in Am Add amlodipine 10mg  at HS Advised to send BP readings via mychart in 2weeks F/up in office in 4weeks

## 2022-07-31 NOTE — Patient Instructions (Signed)
Maintain lisinopril dose at 20mg  in AM Start amlodipine 10mg  at bedtime Monitor BP check in AM Send BP reading via mychart in 2weeks.  Managing Your Hypertension Hypertension, also called high blood pressure, is when the force of the blood pressing against the walls of the arteries is too strong. Arteries are blood vessels that carry blood from your heart throughout your body. Hypertension forces the heart to work harder to pump blood and may cause the arteries to become narrow or stiff. Understanding blood pressure readings A blood pressure reading includes a higher number over a lower number: The first, or top, number is called the systolic pressure. It is a measure of the pressure in your arteries as your heart beats. The second, or bottom number, is called the diastolic pressure. It is a measure of the pressure in your arteries as the heart relaxes. For most people, a normal blood pressure is below 120/80. Your personal target blood pressure may vary depending on your medical conditions, your age, and other factors. Blood pressure is classified into four stages. Based on your blood pressure reading, your health care provider may use the following stages to determine what type of treatment you need, if any. Systolic pressure and diastolic pressure are measured in a unit called millimeters of mercury (mmHg). Normal Systolic pressure: below 616. Diastolic pressure: below 80. Elevated Systolic pressure: 073-710. Diastolic pressure: below 80. Hypertension stage 1 Systolic pressure: 626-948. Diastolic pressure: 54-62. Hypertension stage 2 Systolic pressure: 703 or above. Diastolic pressure: 90 or above. How can this condition affect me? Managing your hypertension is very important. Over time, hypertension can damage the arteries and decrease blood flow to parts of the body, including the brain, heart, and kidneys. Having untreated or uncontrolled hypertension can lead to: A heart attack. A  stroke. A weakened blood vessel (aneurysm). Heart failure. Kidney damage. Eye damage. Memory and concentration problems. Vascular dementia. What actions can I take to manage this condition? Hypertension can be managed by making lifestyle changes and possibly by taking medicines. Your health care provider will help you make a plan to bring your blood pressure within a normal range. You may be referred for counseling on a healthy diet and physical activity. Nutrition  Eat a diet that is high in fiber and potassium, and low in salt (sodium), added sugar, and fat. An example eating plan is called the DASH diet. DASH stands for Dietary Approaches to Stop Hypertension. To eat this way: Eat plenty of fresh fruits and vegetables. Try to fill one-half of your plate at each meal with fruits and vegetables. Eat whole grains, such as whole-wheat pasta, brown rice, or whole-grain bread. Fill about one-fourth of your plate with whole grains. Eat low-fat dairy products. Avoid fatty cuts of meat, processed or cured meats, and poultry with skin. Fill about one-fourth of your plate with lean proteins such as fish, chicken without skin, beans, eggs, and tofu. Avoid pre-made and processed foods. These tend to be higher in sodium, added sugar, and fat. Reduce your daily sodium intake. Many people with hypertension should eat less than 1,500 mg of sodium a day. Lifestyle  Work with your health care provider to maintain a healthy body weight or to lose weight. Ask what an ideal weight is for you. Get at least 30 minutes of exercise that causes your heart to beat faster (aerobic exercise) most days of the week. Activities may include walking, swimming, or biking. Include exercise to strengthen your muscles (resistance exercise), such as weight  lifting, as part of your weekly exercise routine. Try to do these types of exercises for 30 minutes at least 3 days a week. Do not use any products that contain nicotine or  tobacco. These products include cigarettes, chewing tobacco, and vaping devices, such as e-cigarettes. If you need help quitting, ask your health care provider. Control any long-term (chronic) conditions you have, such as high cholesterol or diabetes. Identify your sources of stress and find ways to manage stress. This may include meditation, deep breathing, or making time for fun activities. Alcohol use Do not drink alcohol if: Your health care provider tells you not to drink. You are pregnant, may be pregnant, or are planning to become pregnant. If you drink alcohol: Limit how much you have to: 0-1 drink a day for women. 0-2 drinks a day for men. Know how much alcohol is in your drink. In the [place] one drink equals one 12 oz bottle of beer (355 mL), one 5 oz glass of wine (148 mL), or one 1 oz glass of hard liquor (44 mL). Medicines Your health care provider may prescribe medicine if lifestyle changes are not enough to get your blood pressure under control and if: Your systolic blood pressure is 130 or higher. Your diastolic blood pressure is 80 or higher. Take medicines only as told by your health care provider. Follow the directions carefully. Blood pressure medicines must be taken as told by your health care provider. The medicine does not work as well when you skip doses. Skipping doses also puts you at risk for problems. Monitoring Before you monitor your blood pressure: Do not smoke, drink caffeinated beverages, or exercise within 30 minutes before taking a measurement. Use the bathroom and empty your bladder (urinate). Sit quietly for at least 5 minutes before taking measurements. Monitor your blood pressure at home as told by your health care provider. To do this: Sit with your back straight and supported. Place your feet flat on the floor. Do not cross your legs. Support your arm on a flat surface, such as a table. Make sure your upper arm is at heart level. Each time you  measure, take two or three readings one minute apart and record the results. You may also need to have your blood pressure checked regularly by your health care provider. General information Talk with your health care provider about your diet, exercise habits, and other lifestyle factors that may be contributing to hypertension. Review all the medicines you take with your health care provider because there may be side effects or interactions. Keep all follow-up visits. Your health care provider can help you create and adjust your plan for managing your high blood pressure. Where to find more information National Heart, Lung, and Blood Institute: [employer]is American Heart Association: www.heart.org Contact a health care provider if: You think you are having a reaction to medicines you have taken. You have repeated (recurrent) headaches. You feel dizzy. You have swelling in your ankles. You have trouble with your vision. Get help right away if: You develop a severe headache or confusion. You have unusual weakness or numbness, or you feel faint. You have severe pain in your chest or abdomen. You vomit repeatedly. You have trouble breathing. These symptoms may be an emergency. Get help right away. Call 911. Do not wait to see if the symptoms will go away. Do not drive yourself to the hospital. Summary Hypertension is when the force of blood pumping through your arteries is too strong. If this  condition is not controlled, it may put you at risk for serious complications. Your personal target blood pressure may vary depending on your medical conditions, your age, and other factors. For most people, a normal blood pressure is less than 120/80. Hypertension is managed by lifestyle changes, medicines, or both. Lifestyle changes to help manage hypertension include losing weight, eating a healthy, low-sodium diet, exercising more, stopping smoking, and limiting alcohol. This information is  not intended to replace advice given to you by your health care provider. Make sure you discuss any questions you have with your health care provider. Document Revised: 07/10/2021 Document Reviewed: 07/10/2021 Elsevier Patient Education  2023 [employer].

## 2022-09-15 ENCOUNTER — Telehealth: Payer: Self-pay | Admitting: Nurse Practitioner

## 2022-09-15 DIAGNOSIS — E876 Hypokalemia: Secondary | ICD-10-CM

## 2022-09-15 NOTE — Telephone Encounter (Signed)
Can you call pt about continue taking potassium still

## 2022-09-16 NOTE — Telephone Encounter (Signed)
Pt has been schedule for a lab appointment for repeat BMP , on 09/22/2022  at 8:20 am

## 2022-09-21 NOTE — Telephone Encounter (Signed)
Caller Name: Mahogani Holohan Call back phone #: [phone]  Reason for Call: Please call pt, has questions about what is being ordered for her lab appt.

## 2022-09-22 ENCOUNTER — Other Ambulatory Visit (INDEPENDENT_AMBULATORY_CARE_PROVIDER_SITE_OTHER): Payer: Commercial Managed Care - PPO

## 2022-09-22 DIAGNOSIS — E876 Hypokalemia: Secondary | ICD-10-CM

## 2022-09-22 LAB — BASIC METABOLIC PANEL
BUN: 11 mg/dL (ref 6–23)
CO2: 32 mEq/L (ref 19–32)
Calcium: 9 mg/dL (ref 8.4–10.5)
Chloride: 103 mEq/L (ref 96–112)
Creatinine, Ser: 0.75 mg/dL (ref 0.40–1.20)
GFR: 88.25 mL/min (ref >60.00)
Glucose, Bld: 96 mg/dL (ref 70–99)
Potassium: 3.3 mEq/L — ABNORMAL LOW (ref 3.5–5.1)
Sodium: 141 mEq/L (ref 135–145)

## 2022-09-22 MED ORDER — KLOR-CON M20 20 MEQ PO TBCR: 20.0000 meq | EXTENDED_RELEASE_TABLET | Freq: Every day | ORAL | 0 refills | Status: DC

## 2022-09-22 NOTE — Addendum Note (Signed)
Addended by: Alysia Penna L on: 09/22/2022 12:37 PM   Modules accepted: Orders

## 2022-10-22 ENCOUNTER — Other Ambulatory Visit: Payer: Self-pay | Admitting: Nurse Practitioner

## 2022-10-22 DIAGNOSIS — E876 Hypokalemia: Secondary | ICD-10-CM

## 2022-10-22 NOTE — Telephone Encounter (Signed)
Chart supports Rx Last OV: 07/2022 Next OV: not scheduled  

## 2022-10-23 ENCOUNTER — Telehealth: Payer: Self-pay | Admitting: Nurse Practitioner

## 2022-10-23 NOTE — Telephone Encounter (Signed)
Pt called and stated that you referred her to a colon dr but she do not know the phone number or office  location . Can please call  the pt

## 2022-10-23 NOTE — Telephone Encounter (Signed)
Called & provided pt with office's phone number and name

## 2022-10-30 ENCOUNTER — Encounter: Payer: Self-pay | Admitting: Nurse Practitioner

## 2022-10-30 ENCOUNTER — Ambulatory Visit (INDEPENDENT_AMBULATORY_CARE_PROVIDER_SITE_OTHER): Payer: Commercial Managed Care - PPO | Admitting: Nurse Practitioner

## 2022-10-30 VITALS — BP 160/80 | HR 65 | Temp 97.0°F | Ht 66.5 in | Wt 251.0 lb

## 2022-10-30 DIAGNOSIS — E876 Hypokalemia: Secondary | ICD-10-CM

## 2022-10-30 DIAGNOSIS — E559 Vitamin D deficiency, unspecified: Secondary | ICD-10-CM

## 2022-10-30 DIAGNOSIS — K219 Gastro-esophageal reflux disease without esophagitis: Secondary | ICD-10-CM

## 2022-10-30 DIAGNOSIS — F411 Generalized anxiety disorder: Secondary | ICD-10-CM

## 2022-10-30 DIAGNOSIS — R739 Hyperglycemia, unspecified: Secondary | ICD-10-CM

## 2022-10-30 DIAGNOSIS — I10 Essential (primary) hypertension: Secondary | ICD-10-CM

## 2022-10-30 LAB — BASIC METABOLIC PANEL
BUN: 10 mg/dL (ref 6–23)
CO2: 30 mEq/L (ref 19–32)
Calcium: 9 mg/dL (ref 8.4–10.5)
Chloride: 103 mEq/L (ref 96–112)
Creatinine, Ser: 0.66 mg/dL (ref 0.40–1.20)
GFR: 97.17 mL/min (ref >60.00)
Glucose, Bld: 101 mg/dL — ABNORMAL HIGH (ref 70–99)
Potassium: 3.6 mEq/L (ref 3.5–5.1)
Sodium: 141 mEq/L (ref 135–145)

## 2022-10-30 LAB — HEMOGLOBIN A1C: Hgb A1c MFr Bld: 6 % (ref 4.6–6.5)

## 2022-10-30 MED ORDER — LISINOPRIL 40 MG PO TABS: 40.0000 mg | ORAL_TABLET | Freq: Every day | ORAL | 3 refills | Status: DC

## 2022-10-30 MED ORDER — ESCITALOPRAM OXALATE 10 MG PO TABS: 5.0000 mg | ORAL_TABLET | Freq: Every day | ORAL | 3 refills | Status: AC

## 2022-10-30 MED ORDER — OMEPRAZOLE 20 MG PO CPDR: 20.0000 mg | DELAYED_RELEASE_CAPSULE | Freq: Every day | ORAL | 1 refills | Status: AC

## 2022-10-30 NOTE — Patient Instructions (Addendum)
Stop amlodipine Increase lisinopril to 40mg  daily Continue to monitor BP 3x/week in AM Maintain DASh diet and daily exercise Go to lab F/up in 32month

## 2022-10-30 NOTE — Assessment & Plan Note (Signed)
Unable to tolerate amlodipine: muscle cramps and LE edema despite taking med in PM. No adverse effects with lisinopril BP not at goal. BP Readings from Last 3 Encounters:  10/30/22 (!) 160/80  07/31/22 (!) 160/84  06/25/22 138/88    Stop amlodipine Increase lisinopril to 40mg  daily Repeat BMP Advised about the importance of DASH diet and exercise. F/up in 84month

## 2022-10-30 NOTE — Assessment & Plan Note (Signed)
Repeat vit. D °

## 2022-10-30 NOTE — Progress Notes (Signed)
Established Patient Visit  Patient: Dominique Burton   DOB: Oct 27, 1966   56 y.o. Female  MRN: [id number] Visit Date: 10/30/2022  Subjective:    Chief Complaint  Patient presents with   Office Visit    Medication refill  Checks BP daily     HPI Essential hypertension Unable to tolerate amlodipine: muscle cramps and LE edema despite taking med in PM. No adverse effects with lisinopril BP not at goal. BP Readings from Last 3 Encounters:  10/30/22 (!) 160/80  07/31/22 (!) 160/84  06/25/22 138/88    Stop amlodipine Increase lisinopril to 40mg  daily Repeat BMP Advised about the importance of DASH diet and exercise. F/up in 51month  GAD (generalized anxiety disorder) Stable with lexepro 5mg  Refill sent  Hyperglycemia Repeat hgbA1c  Hypokalemia Repeat BMP  Vitamin D deficiency Repeat vit. D  Reviewed medical, surgical, and social history today  Medications: Outpatient Medications Prior to Visit  Medication Sig   fluticasone (FLONASE) 50 MCG/ACT nasal spray Place 1 spray into both nostrils 2 (two) times daily.   KLOR-CON M20 20 MEQ tablet Take 1 tablet by mouth once daily   Vitamin D, Ergocalciferol, (DRISDOL) 1.25 MG (50000 UNIT) CAPS capsule Take 1 capsule (50,000 Units total) by mouth every 7 (seven) days.   [DISCONTINUED] amLODipine (NORVASC) 10 MG tablet Take 1 tablet (10 mg total) by mouth at bedtime.   [DISCONTINUED] escitalopram (LEXAPRO) 10 MG tablet Take 0.5 tablets (5 mg total) by mouth at bedtime.   [DISCONTINUED] lisinopril (ZESTRIL) 20 MG tablet Take 1 tablet (20 mg total) by mouth daily.   [DISCONTINUED] omeprazole (PRILOSEC) 20 MG capsule Take 1 capsule (20 mg total) by mouth daily.   [DISCONTINUED] cetirizine (ZYRTEC) 10 MG tablet Take 1 tablet (10 mg total) by mouth daily. (Patient not taking: Reported on 10/30/2022)   [DISCONTINUED] ciclopirox (PENLAC) 8 % solution Apply topically at bedtime. Apply over nail and surrounding skin. Apply daily  over previous coat. After seven (7) days, may remove with alcohol and continue cycle. (Patient not taking: Reported on 10/30/2022)   No facility-administered medications prior to visit.   Reviewed past medical and social history.   ROS per HPI above      Objective:  BP (!) 160/80   Pulse 65   Temp (!) 97 F (36.1 C) (Temporal)   Ht 5' 6.5" (1.689 m)   Wt 251 lb (113.9 kg)   SpO2 97%   BMI 39.91 kg/m      Physical Exam Cardiovascular:     Rate and Rhythm: Normal rate.     Pulses: Normal pulses.  Pulmonary:     Effort: Pulmonary effort is normal.  Musculoskeletal:     Right lower leg: Edema present.     Left lower leg: Edema present.  Neurological:     Mental Status: She is alert and oriented to person, place, and time.     No results found for any visits on 10/30/22.    Assessment & Plan:    Problem List Items Addressed This Visit       Cardiovascular and Mediastinum   Essential hypertension - Primary    Unable to tolerate amlodipine: muscle cramps and LE edema despite taking med in PM. No adverse effects with lisinopril BP not at goal. BP Readings from Last 3 Encounters:  10/30/22 (!) 160/80  07/31/22 (!) 160/84  06/25/22 138/88    Stop amlodipine Increase lisinopril to  40mg  daily Repeat BMP Advised about the importance of DASH diet and exercise. F/up in 17month      Relevant Medications   lisinopril (ZESTRIL) 40 MG tablet   Other Relevant Orders   Basic metabolic panel     Other   GAD (generalized anxiety disorder)    Stable with lexepro 5mg  Refill sent      Relevant Medications   escitalopram (LEXAPRO) 10 MG tablet   Hyperglycemia    Repeat hgbA1c      Relevant Orders   Hemoglobin A1c   Hypokalemia    Repeat BMP      Relevant Orders   Basic metabolic panel   Vitamin D deficiency    Repeat vit. D      Relevant Orders   VITAMIN D 25 Hydroxy (Vit-D Deficiency, Fractures)   Other Visit Diagnoses     Gastroesophageal reflux  disease without esophagitis       Relevant Medications   omeprazole (PRILOSEC) 20 MG capsule      Return in about 4 weeks (around 11/27/2022) for HTN.     , NP

## 2022-10-30 NOTE — Assessment & Plan Note (Signed)
Repeat hgbA1c 

## 2022-10-30 NOTE — Assessment & Plan Note (Addendum)
Stable with lexepro 5mg  Refill sent

## 2022-10-30 NOTE — Assessment & Plan Note (Signed)
-   Repeat BMP

## 2022-10-31 LAB — VITAMIN D 25 HYDROXY (VIT D DEFICIENCY, FRACTURES): VITD: 22.19 ng/mL — ABNORMAL LOW (ref 30.00–100.00)

## 2022-11-01 MED ORDER — VITAMIN D (ERGOCALCIFEROL) 1.25 MG (50000 UNIT) PO CAPS: 50000.0000 [IU] | ORAL_CAPSULE | ORAL | 0 refills | Status: AC

## 2022-11-01 NOTE — Addendum Note (Signed)
Addended by: Alysia Penna L on: 11/01/2022 11:45 AM   Modules accepted: Orders

## 2022-11-13 ENCOUNTER — Encounter: Payer: Self-pay | Admitting: Nurse Practitioner

## 2022-11-13 DIAGNOSIS — I10 Essential (primary) hypertension: Secondary | ICD-10-CM

## 2022-11-16 ENCOUNTER — Other Ambulatory Visit: Payer: Commercial Managed Care - PPO

## 2022-11-16 MED ORDER — HYDRALAZINE HCL 25 MG PO TABS: 25.0000 mg | ORAL_TABLET | Freq: Two times a day (BID) | ORAL | 5 refills | Status: AC

## 2022-12-18 ENCOUNTER — Telehealth: Payer: Self-pay | Admitting: Nurse Practitioner

## 2022-12-18 NOTE — Telephone Encounter (Signed)
I tried to call to reschedule follow up for HTN. lvm

## 2023-03-20 ENCOUNTER — Other Ambulatory Visit: Payer: Self-pay | Admitting: Nurse Practitioner

## 2023-03-20 DIAGNOSIS — E559 Vitamin D deficiency, unspecified: Secondary | ICD-10-CM

## 2023-05-19 ENCOUNTER — Other Ambulatory Visit: Payer: Self-pay | Admitting: Nurse Practitioner

## 2023-05-19 DIAGNOSIS — E559 Vitamin D deficiency, unspecified: Secondary | ICD-10-CM

## 2023-05-19 NOTE — Telephone Encounter (Signed)
Per provider take otc supplement

## 2023-11-19 ENCOUNTER — Other Ambulatory Visit (HOSPITAL_COMMUNITY): Payer: Self-pay | Admitting: Family Medicine

## 2023-11-19 DIAGNOSIS — Z8249 Family history of ischemic heart disease and other diseases of the circulatory system: Secondary | ICD-10-CM

## 2023-12-02 ENCOUNTER — Other Ambulatory Visit (HOSPITAL_COMMUNITY): Payer: Self-pay

## 2023-12-14 ENCOUNTER — Other Ambulatory Visit (HOSPITAL_COMMUNITY): Payer: Self-pay

## 2023-12-15 ENCOUNTER — Encounter (HOSPITAL_COMMUNITY): Payer: Self-pay

## 2023-12-15 ENCOUNTER — Ambulatory Visit (HOSPITAL_COMMUNITY): Payer: Self-pay

## 2023-12-30 ENCOUNTER — Ambulatory Visit (HOSPITAL_COMMUNITY)
Admission: RE | Admit: 2023-12-30 | Discharge: 2023-12-30 | Disposition: A | Discharge status: Home / Self Care | Payer: Self-pay | Source: Ambulatory Visit | Attending: Family Medicine | Admitting: Family Medicine

## 2023-12-30 ENCOUNTER — Ambulatory Visit (HOSPITAL_COMMUNITY): Payer: Self-pay

## 2023-12-30 DIAGNOSIS — Z8249 Family history of ischemic heart disease and other diseases of the circulatory system: Secondary | ICD-10-CM | POA: Insufficient documentation

## 2024-04-12 ENCOUNTER — Other Ambulatory Visit (INDEPENDENT_AMBULATORY_CARE_PROVIDER_SITE_OTHER)

## 2024-04-12 ENCOUNTER — Ambulatory Visit (INDEPENDENT_AMBULATORY_CARE_PROVIDER_SITE_OTHER): Payer: Self-pay | Admitting: Orthopedic Surgery

## 2024-04-12 ENCOUNTER — Encounter: Payer: Self-pay | Admitting: Orthopedic Surgery

## 2024-04-12 ENCOUNTER — Telehealth: Payer: Self-pay

## 2024-04-12 VITALS — Ht 66.5 in | Wt 249.0 lb

## 2024-04-12 DIAGNOSIS — M1711 Unilateral primary osteoarthritis, right knee: Secondary | ICD-10-CM

## 2024-04-12 DIAGNOSIS — M1712 Unilateral primary osteoarthritis, left knee: Secondary | ICD-10-CM

## 2024-04-12 MED ORDER — BUPIVACAINE HCL 0.25 % IJ SOLN
4.0000 mL | INTRAMUSCULAR | Status: AC | PRN
  Administered 2024-04-12: 4 mL via INTRA_ARTICULAR

## 2024-04-12 MED ORDER — TRIAMCINOLONE ACETONIDE 40 MG/ML IJ SUSP
40.0000 mg | INTRAMUSCULAR | Status: AC | PRN
  Administered 2024-04-12: 40 mg via INTRA_ARTICULAR

## 2024-04-12 MED ORDER — LIDOCAINE HCL 1 % IJ SOLN
5.0000 mL | INTRAMUSCULAR | Status: AC | PRN
  Administered 2024-04-12: 5 mL

## 2024-04-12 NOTE — Progress Notes (Signed)
 Office Visit Note   Patient: Dominique Burton           Date of Birth: July 16, 1966           MRN: [id number] Visit Date: 04/12/2024 Requested by: Kandace Organ, NP [address] [place],  [place] [address] PCP: Kandace Organ, NP  Subjective: Chief Complaint  Patient presents with   Right Knee - Pain   Left Knee - Pain    HPI: Dominique Burton is a 58 y.o. female who presents to the office reporting bilateral knee pain right worse than left.  Dr. Armandina Bernard did an injection in her right knee back in 2021 which gave her very good relief.  Had that injection repeated in 2023 which did not help as much.  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  Denies much in the way of mechanical symptoms..                ROS: All systems reviewed are negative as they relate to the chief complaint within the history of present illness.  Patient denies fevers or chills.  Assessment & Plan: Visit Diagnoses:  1. Unilateral primary osteoarthritis, left knee   2. Unilateral primary osteoarthritis, right knee     Plan: Impression is right knee arthritis.  Right knee injection performed today.  Aspirated about 10 cc as well.  Will see if that helps her.  Would also like to preapproved for for gel injection.  We can put that in once this cortisone shot wears off. This patient is diagnosed with osteoarthritis of the knee(s).    Radiographs show evidence of joint space narrowing, osteophytes, subchondral sclerosis and/or subchondral cysts.  This patient has knee pain which interferes with functional and activities of daily living.    This patient has experienced inadequate response, adverse effects and/or intolerance with conservative treatments such as acetaminophen, NSAIDS, topical creams, physical therapy or regular exercise, knee bracing and/or weight loss.   This patient has experienced inadequate response or has a contraindication to intra articular steroid injections for at least 3 months.    This patient is not scheduled to have a total knee replacement within 6 months of starting treatment with viscosupplementation.   Follow-Up Instructions: No follow-ups on file.   Orders:  Orders Placed This Encounter  Procedures   XR KNEE 3 VIEW LEFT   XR KNEE 3 VIEW RIGHT   No orders of the defined types were placed in this encounter.     Procedures: Large Joint Inj: R knee on 04/12/2024 11:52 AM Indications: diagnostic evaluation, joint swelling and pain Details: 18 G 1.5 in needle, superolateral approach  Arthrogram: No  Medications: 5 mL lidocaine  1 %; 4 mL bupivacaine  0.25 %; 40 mg triamcinolone acetonide 40 MG/ML Outcome: tolerated well, no immediate complications Procedure, treatment alternatives, risks and benefits explained, specific risks discussed. Consent was given by the patient. Immediately prior to procedure a time out was called to verify the correct patient, procedure, equipment, support staff and site/side marked as required. Patient was prepped and draped in the usual sterile fashion.       Clinical Data: No additional findings.  Objective: Vital Signs: Ht 5' 6.5" (1.689 m)   Wt 249 lb (112.9 kg)   BMI 39.59 kg/m   Physical Exam:  Constitutional: Patient appears well-developed HEENT:  Head: Normocephalic Eyes:EOM are normal Neck: Normal range of motion Cardiovascular: Normal rate Pulmonary/chest: Effort normal Neurologic: Patient is alert Skin: Skin is warm  Psychiatric: Patient has normal mood and affect  Ortho Exam: Ortho exam demonstrates normal gait alignment.  Mild effusion right knee no effusion left knee.  Collateral crucial ligaments are stable.  Extensor mechanism intact.  No masses lymphadenopathy or skin changes noted in that right or left knee region.  Patellofemoral crepitus is mild.  Specialty Comments:  No specialty comments available.  Imaging: No results found.   PMFS History: Patient Active Problem List   Diagnosis  Date Noted   Postprandial abdominal bloating 06/25/2022   Snoring 01/13/2022   GAD (generalized anxiety disorder) 08/22/2021   Hyperglycemia 01/02/2021   Class 2 severe obesity due to excess calories with serious comorbidity and body mass index (BMI) of 39.0 to 39.9 in adult [facility]) 01/02/2021   Essential hypertension 01/11/2018   Vitamin D  deficiency 01/11/2018   Hypokalemia 01/11/2018   Past Medical History:  Diagnosis Date   Hypertension    Vitamin D  deficiency     Family History  Problem Relation Age of Onset   Hypertension Mother    Diabetes Father    Hypertension Father    Hypertension Sister    Heart disease Brother    Hypertension Brother    Hypertension Maternal Grandmother    Hypertension Maternal Grandfather     Past Surgical History:  Procedure Laterality Date   ABDOMINAL HYSTERECTOMY     PARTIAL   CESAREAN SECTION     Social History   Occupational History   Not on file  Tobacco Use   Smoking status: Never   Smokeless tobacco: Never  Vaping Use   Vaping status: Never Used  Substance and Sexual Activity   Alcohol use: No   Drug use: No   Sexual activity: Yes

## 2024-04-12 NOTE — Telephone Encounter (Signed)
Right knee gel 

## 2024-04-14 NOTE — Telephone Encounter (Signed)
 VOB submitted for Durolane, right knee.

## 2024-04-26 ENCOUNTER — Other Ambulatory Visit: Payer: Self-pay

## 2024-04-26 DIAGNOSIS — M1711 Unilateral primary osteoarthritis, right knee: Secondary | ICD-10-CM

## 2024-04-27 ENCOUNTER — Ambulatory Visit: Admitting: Orthopedic Surgery

## 2024-05-26 ENCOUNTER — Ambulatory Visit: Admitting: Orthopedic Surgery

## 2024-06-14 ENCOUNTER — Ambulatory Visit: Admitting: Orthopedic Surgery

## 2024-09-11 ENCOUNTER — Encounter: Payer: Self-pay | Admitting: Radiology
